# Patient Record
Sex: Female | Born: 1985 | Race: Black or African American | Hispanic: No | Marital: Single | Smoking: Former smoker
Health system: Southern US, Community
[De-identification: ages and names within clinical notes are randomized; demographics above are authoritative.]

## PROBLEM LIST (undated history)

## (undated) ENCOUNTER — Inpatient Hospital Stay (HOSPITAL_COMMUNITY): Payer: Self-pay

## (undated) DIAGNOSIS — Z789 Other specified health status: Secondary | ICD-10-CM

## (undated) DIAGNOSIS — G43909 Migraine, unspecified, not intractable, without status migrainosus: Secondary | ICD-10-CM

## (undated) HISTORY — DX: Migraine, unspecified, not intractable, without status migrainosus: G43.909

---

## 2012-12-27 ENCOUNTER — Emergency Department (HOSPITAL_COMMUNITY)
Admission: EM | Admit: 2012-12-27 | Discharge: 2012-12-27 | Disposition: A | Discharge status: Home / Self Care | Payer: 59 | Attending: Emergency Medicine | Admitting: Emergency Medicine

## 2012-12-27 ENCOUNTER — Encounter (HOSPITAL_COMMUNITY): Payer: Self-pay | Admitting: *Deleted

## 2012-12-27 DIAGNOSIS — R52 Pain, unspecified: Secondary | ICD-10-CM | POA: Insufficient documentation

## 2012-12-27 DIAGNOSIS — R0789 Other chest pain: Secondary | ICD-10-CM

## 2012-12-27 DIAGNOSIS — R071 Chest pain on breathing: Secondary | ICD-10-CM | POA: Insufficient documentation

## 2012-12-27 DIAGNOSIS — F17200 Nicotine dependence, unspecified, uncomplicated: Secondary | ICD-10-CM | POA: Insufficient documentation

## 2012-12-27 LAB — POCT I-STAT, CHEM 8
Calcium, Ion: 1.15 mmol/L (ref 1.12–1.23)
Chloride: 105 mEq/L (ref 96–112)
Creatinine, Ser: 0.7 mg/dL (ref 0.50–1.10)
Glucose, Bld: 83 mg/dL (ref 70–99)
Hemoglobin: 12.2 g/dL (ref 12.0–15.0)
Potassium: 3.9 mEq/L (ref 3.5–5.1)

## 2012-12-27 NOTE — ED Provider Notes (Signed)
Medical screening examination/treatment/procedure(s) were performed by non-physician practitioner and as supervising physician I was immediately available for consultation/collaboration.   Rolan Bucco, MD 12/27/12 [phone]

## 2012-12-27 NOTE — ED Provider Notes (Signed)
History     CSN: [id number]  Arrival date & time 12/27/12  1249   First MD Initiated Contact with Patient 12/27/12 1321      Chief Complaint  Patient presents with  . Chest Pain    (Consider location/radiation/quality/duration/timing/severity/associated sxs/prior treatment) HPI Comments: Shelby Love is a 27 y.o. Female that presents emergency department with chief complaint of chest pain.  Onset of symptoms began this morning around 1130 and is located primarily in the left chest wall.  Patient is on aware of any alleviating symptoms the pain is exacerbated by certain arm movements and deep breath.  Pain is described as a tight sensation that is tender to touch.  Patient denies any associated symptoms including shortness of breath, diaphoresis, nausea, cough, hemoptysis, leg swelling, claudication, syncope, fever, night sweats or chills.  Note that patient is a current everyday smoker and is taking estrogen birth control.  No other complaints this time.    The history is provided by the patient.    History reviewed. No pertinent past medical history.  History reviewed. No pertinent past surgical history.  History reviewed. No pertinent family history.  History  Substance Use Topics  . Smoking status: Current Every Day Smoker    Types: Cigarettes  . Smokeless tobacco: Not on file  . Alcohol Use: Yes     Comment: occa    OB History    Grav Para Term Preterm Abortions TAB SAB Ect Mult Living                  Review of Systems  Constitutional: Negative for fever, chills, diaphoresis, fatigue and unexpected weight change.  HENT: Negative for congestion, neck pain and neck stiffness.   Eyes: Negative for visual disturbance.  Respiratory: Positive for chest tightness. Negative for apnea, cough, shortness of breath, wheezing and stridor.   Cardiovascular: Positive for chest pain. Negative for palpitations and leg swelling.  Gastrointestinal: Negative for nausea, vomiting,  abdominal pain, diarrhea and blood in stool.  Genitourinary: Negative for dysuria, urgency, hematuria and flank pain.  Musculoskeletal: Negative for myalgias, back pain and gait problem.  Skin: Negative for pallor.  Neurological: Negative for dizziness, syncope, light-headedness and headaches.  All other systems reviewed and are negative.    Allergies  Banana and Erythromycin base  Home Medications   Current Outpatient Rx  Name  Route  Sig  Dispense  Refill  . VITAMIN C 500 MG PO CHEW   Oral   Chew 500 mg by mouth daily.         Marland Kitchen DOXYCYCLINE HYCLATE 100 MG PO TABS   Oral   Take 100 mg by mouth daily.         . DROSPIRENONE-ETHINYL ESTRADIOL 3-0.02 MG PO TABS   Oral   Take 1 tablet by mouth daily.           BP 131/74  Pulse 98  Temp 98.9 F (37.2 C) (Oral)  Resp 20  SpO2 100%  Physical Exam  Nursing note and vitals reviewed. Constitutional: She appears well-developed and well-nourished. No distress.  HENT:  Head: Normocephalic and atraumatic.  Eyes: Conjunctivae normal and EOM are normal. Pupils are equal, round, and reactive to light.  Neck: Normal range of motion. Neck supple. Normal carotid pulses and no JVD present. Carotid bruit is not present. No rigidity. Normal range of motion present.  Cardiovascular: Normal rate, regular rhythm, S1 normal, S2 normal, normal heart sounds, intact distal pulses and normal pulses.  Exam reveals  no gallop and no friction rub.   No murmur heard.      No pitting edema bilaterally, RRR, no aberrant sounds on auscultations, distal pulses intact, no carotid bruit or JVD.   Pulmonary/Chest: Effort normal and breath sounds normal. No accessory muscle usage or stridor. No respiratory distress. She exhibits tenderness. She exhibits no bony tenderness.       Reproducible left-sided chest wall tenderness.  Abdominal: Bowel sounds are normal.       Soft non tender. Non pulsatile aorta.   Skin: Skin is warm, dry and intact. No rash  noted. She is not diaphoretic. No cyanosis. Nails show no clubbing.    ED Course  Procedures (including critical care time)   Labs Reviewed  D-DIMER, QUANTITATIVE  POCT I-STAT, CHEM 8   No results found.   No diagnosis found.   Date: 12/27/2012  Rate: 84  Rhythm: normal sinus rhythm  QRS Axis: normal  Intervals: normal  ST/T Wave abnormalities: normal  Conduction Disutrbances: none  Narrative Interpretation:   Old EKG Reviewed: No old ECG     MDM  Patient is to be discharged with recommendation to follow up with PCP in regards to today's hospital visit. Chest pain is not likely of cardiac or pulmonary etiology d/t presentation, negative ddimer, VSS, no tracheal deviation, no JVD or new murmur, RRR, breath sounds equal bilaterally, EKG without acute abnormalities.  Smoking cessation discussed as well as increased risk factors for pulmonary embolism development while being on birth control when smoking.  Patient verbalizes understanding that she either needs to stop taking her birth control or quit smoking.  Likely etiology of chest pain is musculoskeletal. Pt appears reliable for follow up and is agreeable to discharge.            Jaci Carrel, [place] 12/27/12 1528

## 2012-12-27 NOTE — ED Notes (Signed)
Pt reports L side upper chest pain which started x 1 hour ago while sitting at work.  Pt describes chest pain being sore, which radiates under her L arm.  Pt has family hx of MI.  Pt denies any SOB or n/v at this time.

## 2013-04-15 ENCOUNTER — Encounter (HOSPITAL_COMMUNITY): Payer: Self-pay | Admitting: Emergency Medicine

## 2013-04-15 ENCOUNTER — Emergency Department (HOSPITAL_COMMUNITY)
Admission: EM | Admit: 2013-04-15 | Discharge: 2013-04-15 | Disposition: A | Discharge status: Home / Self Care | Payer: 59 | Attending: Emergency Medicine | Admitting: Emergency Medicine

## 2013-04-15 DIAGNOSIS — Y9389 Activity, other specified: Secondary | ICD-10-CM | POA: Insufficient documentation

## 2013-04-15 DIAGNOSIS — S61211A Laceration without foreign body of left index finger without damage to nail, initial encounter: Secondary | ICD-10-CM

## 2013-04-15 DIAGNOSIS — S61209A Unspecified open wound of unspecified finger without damage to nail, initial encounter: Secondary | ICD-10-CM | POA: Insufficient documentation

## 2013-04-15 DIAGNOSIS — Z23 Encounter for immunization: Secondary | ICD-10-CM | POA: Insufficient documentation

## 2013-04-15 DIAGNOSIS — W260XXA Contact with knife, initial encounter: Secondary | ICD-10-CM | POA: Insufficient documentation

## 2013-04-15 DIAGNOSIS — Y929 Unspecified place or not applicable: Secondary | ICD-10-CM | POA: Insufficient documentation

## 2013-04-15 DIAGNOSIS — F17200 Nicotine dependence, unspecified, uncomplicated: Secondary | ICD-10-CM | POA: Insufficient documentation

## 2013-04-15 MED ORDER — TETANUS-DIPHTH-ACELL PERTUSSIS 5-2.5-18.5 LF-MCG/0.5 IM SUSP
0.5000 mL | Freq: Once | INTRAMUSCULAR | Status: AC
  Administered 2013-04-15: 0.5 mL via INTRAMUSCULAR
  Filled 2013-04-15: qty 0.5

## 2013-04-15 NOTE — ED Notes (Signed)
Patient was trying to open a bag of sugar when the knife slipped and cut her left 2nd finger.

## 2013-04-15 NOTE — ED Provider Notes (Signed)
History    This chart was scribed for Emilia Beck PA-C, a non-physician practitioner working with Raeford Razor, MD by Lewanda Rife, ED Scribe. This patient was seen in room WTR9/WTR9 and the patient's care was started at 2234.     CSN: [id number]  Arrival date & time 04/15/13  2116   First MD Initiated Contact with Patient 04/15/13 2143      Chief Complaint  Patient presents with  . Extremity Laceration    (Consider location/radiation/quality/duration/timing/severity/associated sxs/prior treatment) HPI Shelby Love is a 27 y.o. female who presents to the Emergency Department complaining of laceration of left index finger pad onset prior to arrival. Reports she was opening a bag of sugar with a large knife and it slipped. Pt reports moderate constant pain. Reports she was able to control bleeding with pressure dressing. Denies any aggravating alleviating factors. Pt denies taking any medications prior to arrival to pain. Reports unknown tetanus status.    History reviewed. No pertinent past medical history.  History reviewed. No pertinent past surgical history.  No family history on file.  History  Substance Use Topics  . Smoking status: Current Every Day Smoker    Types: Cigarettes  . Smokeless tobacco: Not on file  . Alcohol Use: Yes     Comment: occa    OB History   Grav Para Term Preterm Abortions TAB SAB Ect Mult Living                  Review of Systems A complete 10 system review of systems was obtained and all systems are negative except as noted in the HPI and PMH.    Allergies  Banana and Erythromycin base  Home Medications   Current Outpatient Rx  Name  Route  Sig  Dispense  Refill  . Ascorbic Acid (VITAMIN C) 500 MG CHEW   Oral   Chew 500 mg by mouth daily.         Marland Kitchen doxycycline (VIBRA-TABS) 100 MG tablet   Oral   Take 100 mg by mouth daily.         . drospirenone-ethinyl estradiol (LORYNA) 3-0.02 MG tablet   Oral   Take 1 tablet  by mouth daily.           BP 128/86  Pulse 95  Temp(Src) 98.5 F (36.9 C) (Oral)  Resp 20  Wt 147 lb (66.679 kg)  SpO2 98%  LMP 03/25/2013  Physical Exam  Nursing note and vitals reviewed. Constitutional: She is oriented to person, place, and time. She appears well-developed and well-nourished. No distress.  HENT:  Head: Normocephalic and atraumatic.  Eyes: EOM are normal.  Neck: Neck supple. No tracheal deviation present.  Cardiovascular: Normal rate.   Pulmonary/Chest: Effort normal. No respiratory distress.  Musculoskeletal: Normal range of motion.  Neurological: She is alert and oriented to person, place, and time.  Skin: Skin is warm and dry. Laceration noted.     1.5 cm laceration noted on distal aspect of left index finger pad  Psychiatric: She has a normal mood and affect. Her behavior is normal.    ED Course  Procedures (including critical care time) Medications - No data to display LACERATION REPAIR Performed by: Emilia Beck PA-C Consent: Verbal consent obtained. Risks and benefits: risks, benefits and alternatives were discussed Patient identity confirmed: provided demographic data Time out performed prior to procedure Prepped and Draped in normal sterile fashion Wound explored Laceration Location: distal aspect of left index finger on finger pad Laceration  Length: 1.5 cm No Foreign Bodies seen or palpated Anesthesia: local infiltration Local anesthetic: lidocaine 1% without epinephrine Anesthetic total: 5 ml Irrigation method: syringe Amount of cleaning: standard Skin closure: 4-0 Prolene Number of sutures or staples: 3 sutures Technique: simple interrupted  Patient tolerance: Patient tolerated the procedure well with no immediate complications.  Labs Reviewed - No data to display No results found.   1. Laceration of left index finger w/o foreign body w/o damage to nail, initial encounter       MDM  Laceration repaired without  complications. Patient will have tetanus shot here. Patient instructed to return in 10 days for suture removal. Patient instructed to return with worsening or concerning symptoms.       I personally performed the services described in this documentation, which was scribed in my presence. The recorded information has been reviewed and is accurate.    Emilia Beck, PA-C 04/16/13 1519

## 2013-04-23 NOTE — ED Provider Notes (Signed)
Medical screening examination/treatment/procedure(s) were performed by non-physician practitioner and as supervising physician I was immediately available for consultation/collaboration.  Raeford Razor, MD 04/23/13 [phone]

## 2013-09-30 LAB — OB RESULTS CONSOLE RPR: RPR: NONREACTIVE

## 2013-09-30 LAB — OB RESULTS CONSOLE ABO/RH: RH Type: POSITIVE

## 2013-09-30 LAB — OB RESULTS CONSOLE RUBELLA ANTIBODY, IGM: Rubella: UNDETERMINED

## 2013-09-30 LAB — OB RESULTS CONSOLE HIV ANTIBODY (ROUTINE TESTING): HIV: NONREACTIVE

## 2013-09-30 LAB — OB RESULTS CONSOLE HEPATITIS B SURFACE ANTIGEN: HEP B S AG: NEGATIVE

## 2013-10-10 LAB — OB RESULTS CONSOLE GC/CHLAMYDIA
Chlamydia: NEGATIVE
Gonorrhea: NEGATIVE

## 2013-12-19 NOTE — L&D Delivery Note (Signed)
Delivery Note At 11:09 AM a viable female was delivered via Vaginal, Spontaneous Delivery (Presentation: ; Occiput Anterior).  APGAR: 9, 9; weight pending Placenta status: Intact, Spontaneous.  Cord: 3 vessels with the following complications: None.  Cord pH: not indicated  Anesthesia: Epidural  Episiotomy: None Lacerations: 1st degree Suture Repair: 3.0 vicryl rapide and 2 interrupted 4-0 vicryl rapide sutures Est. Blood Loss (mL):   Mom to postpartum.  Baby to Couplet care / Skin to Skin.  Jatia Musa A. Delaynee Alred 04/19/2014, 11:31 AM

## 2014-03-27 LAB — OB RESULTS CONSOLE GBS: STREP GROUP B AG: NEGATIVE

## 2014-04-18 ENCOUNTER — Encounter (HOSPITAL_COMMUNITY): Payer: Self-pay | Admitting: *Deleted

## 2014-04-18 ENCOUNTER — Inpatient Hospital Stay (HOSPITAL_COMMUNITY)
Admission: AD | Admit: 2014-04-18 | Discharge: 2014-04-21 | DRG: 775 | Disposition: A | Discharge status: Home / Self Care | Payer: 59 | Source: Ambulatory Visit | Attending: Obstetrics | Admitting: Obstetrics

## 2014-04-18 DIAGNOSIS — O34599 Maternal care for other abnormalities of gravid uterus, unspecified trimester: Secondary | ICD-10-CM | POA: Diagnosis present

## 2014-04-18 DIAGNOSIS — O139 Gestational [pregnancy-induced] hypertension without significant proteinuria, unspecified trimester: Principal | ICD-10-CM | POA: Diagnosis present

## 2014-04-18 DIAGNOSIS — N83209 Unspecified ovarian cyst, unspecified side: Secondary | ICD-10-CM | POA: Diagnosis present

## 2014-04-18 DIAGNOSIS — D62 Acute posthemorrhagic anemia: Secondary | ICD-10-CM | POA: Diagnosis not present

## 2014-04-18 DIAGNOSIS — O9903 Anemia complicating the puerperium: Secondary | ICD-10-CM | POA: Diagnosis not present

## 2014-04-18 DIAGNOSIS — O99334 Smoking (tobacco) complicating childbirth: Secondary | ICD-10-CM | POA: Diagnosis present

## 2014-04-18 HISTORY — DX: Other specified health status: Z78.9

## 2014-04-18 LAB — TYPE AND SCREEN
ABO/RH(D): O POS
Antibody Screen: NEGATIVE

## 2014-04-18 LAB — CBC
HCT: 39 % (ref 36.0–46.0)
Hemoglobin: 13 g/dL (ref 12.0–15.0)
MCH: 29.1 pg (ref 26.0–34.0)
MCHC: 33.3 g/dL (ref 30.0–36.0)
MCV: 87.2 fL (ref 78.0–100.0)
PLATELETS: 322 10*3/uL (ref 150–400)
RBC: 4.47 MIL/uL (ref 3.87–5.11)
RDW: 15.3 % (ref 11.5–15.5)
WBC: 13.4 10*3/uL — ABNORMAL HIGH (ref 4.0–10.5)

## 2014-04-18 LAB — COMPREHENSIVE METABOLIC PANEL
ALT: 14 U/L (ref 0–35)
AST: 18 U/L (ref 0–37)
Albumin: 3.1 g/dL — ABNORMAL LOW (ref 3.5–5.2)
Alkaline Phosphatase: 151 U/L — ABNORMAL HIGH (ref 39–117)
BUN: 4 mg/dL — ABNORMAL LOW (ref 6–23)
CALCIUM: 10.3 mg/dL (ref 8.4–10.5)
CO2: 22 mEq/L (ref 19–32)
CREATININE: 0.51 mg/dL (ref 0.50–1.10)
Chloride: 98 mEq/L (ref 96–112)
GFR calc Af Amer: >90 mL/min (ref >90)
GFR calc non Af Amer: >90 mL/min (ref >90)
GLUCOSE: 85 mg/dL (ref 70–99)
Potassium: 3.7 mEq/L (ref 3.7–5.3)
SODIUM: 138 meq/L (ref 137–147)
Total Bilirubin: 0.2 mg/dL — ABNORMAL LOW (ref 0.3–1.2)
Total Protein: 7.6 g/dL (ref 6.0–8.3)

## 2014-04-18 LAB — ABO/RH: ABO/RH(D): O POS

## 2014-04-18 LAB — URIC ACID: Uric Acid, Serum: 5.3 mg/dL (ref 2.4–7.0)

## 2014-04-18 LAB — RPR

## 2014-04-18 MED ORDER — OXYTOCIN 40 UNITS IN LACTATED RINGERS INFUSION - SIMPLE MED
62.5000 mL/h | INTRAVENOUS | Status: DC
  Administered 2014-04-19: 62.5 mL/h via INTRAVENOUS

## 2014-04-18 MED ORDER — LIDOCAINE HCL (PF) 1 % IJ SOLN
30.0000 mL | INTRAMUSCULAR | Status: DC | PRN
  Filled 2014-04-18: qty 30

## 2014-04-18 MED ORDER — OXYCODONE-ACETAMINOPHEN 5-325 MG PO TABS
1.0000 | ORAL_TABLET | ORAL | Status: DC | PRN
Start: 2014-04-18 — End: 2014-04-19

## 2014-04-18 MED ORDER — LABETALOL HCL 100 MG PO TABS
100.0000 mg | ORAL_TABLET | Freq: Once | ORAL | Status: AC
Start: 2014-04-18 — End: 2014-04-18
  Administered 2014-04-18: 100 mg via ORAL
  Filled 2014-04-18: qty 1

## 2014-04-18 MED ORDER — CITRIC ACID-SODIUM CITRATE 334-500 MG/5ML PO SOLN
30.0000 mL | ORAL | Status: DC | PRN
  Administered 2014-04-18: 30 mL via ORAL
  Filled 2014-04-18: qty 15

## 2014-04-18 MED ORDER — ACETAMINOPHEN 325 MG PO TABS
650.0000 mg | ORAL_TABLET | ORAL | Status: DC | PRN
  Administered 2014-04-18 – 2014-04-19 (×3): 650 mg via ORAL
  Filled 2014-04-18 (×3): qty 2

## 2014-04-18 MED ORDER — OXYTOCIN 40 UNITS IN LACTATED RINGERS INFUSION - SIMPLE MED
1.0000 m[IU]/min | INTRAVENOUS | Status: DC
  Administered 2014-04-18: 2 m[IU]/min via INTRAVENOUS
  Filled 2014-04-18: qty 1000

## 2014-04-18 MED ORDER — ZOLPIDEM TARTRATE 5 MG PO TABS: 5.0000 mg | ORAL_TABLET | Freq: Every evening | ORAL | Status: DC | PRN

## 2014-04-18 MED ORDER — LABETALOL HCL 5 MG/ML IV SOLN
10.0000 mg | Freq: Once | INTRAVENOUS | Status: AC
  Administered 2014-04-18: 10 mg via INTRAVENOUS
  Filled 2014-04-18: qty 4

## 2014-04-18 MED ORDER — LACTATED RINGERS IV SOLN: 500.0000 mL | INTRAVENOUS | Status: DC | PRN

## 2014-04-18 MED ORDER — IBUPROFEN 600 MG PO TABS: 600.0000 mg | ORAL_TABLET | Freq: Four times a day (QID) | ORAL | Status: DC | PRN

## 2014-04-18 MED ORDER — OXYTOCIN BOLUS FROM INFUSION: 500.0000 mL | INTRAVENOUS | Status: DC

## 2014-04-18 MED ORDER — ONDANSETRON HCL 4 MG/2ML IJ SOLN: 4.0000 mg | Freq: Four times a day (QID) | INTRAMUSCULAR | Status: DC | PRN

## 2014-04-18 MED ORDER — TERBUTALINE SULFATE 1 MG/ML IJ SOLN
0.2500 mg | Freq: Once | INTRAMUSCULAR | Status: AC | PRN
Start: 2014-04-18 — End: 2014-04-18

## 2014-04-18 MED ORDER — LACTATED RINGERS IV SOLN
INTRAVENOUS | Status: DC
  Administered 2014-04-18 – 2014-04-19 (×3): via INTRAVENOUS

## 2014-04-18 NOTE — Progress Notes (Signed)
S: Doing well, no complaints, pain well controlled, just starting to feel contractions, not painful, planning epidural. HA earlier today, very mild at present, no vision change.   O: BP 150/99  Pulse 86  Temp(Src) 98.2 F (36.8 C) (Oral)  Resp 20  Ht 5\' 7"  (1.702 m)  Wt 91.627 kg (202 lb)  BMI 31.63 kg/m2   FHT:  FHR: 140s bpm, variability: moderate,  accelerations:  Present,  decelerations:  Absent UC:   irregular, every 5 minutes SVE:   Dilation: 2 Effacement (%): 60 Station: -3 Exam by:: Dr. Algie CofferFogelman   A / P:  [age] y.o.  Obstetric History   G1   P0   T0   P0   A0   TAB0   SAB0   E0   M0   L0    at 6369w3d IOL due to gest htn, no sx PEC, bp controlled w/ one IV dose and one po dose labetalol Cont to increase pitocin  Fetal Wellbeing:  Category I Pain Control:  epidural planned  Anticipated MOD:  NSVD  Shelby Love A. Shelby Love 04/18/2014, 8:56 PM

## 2014-04-18 NOTE — Progress Notes (Signed)
Dr Ernestina PennaFogleman updated on BP, pulse, lab values, FHR, UC pattern and pt comfort level.  Orders given to give 100mg  PO labetalol once and 10mg  IV labetalol once.  RN to continue to monitor BP and will notify physician if BP continues to be high.

## 2014-04-18 NOTE — H&P (Signed)
Shelby HamiltonMiesha Love is a 28 y.o. G1P0 at 7561w3d presenting for IOL due to worsening gestational hypertension. Pt notes occasional contractions . Good fetal movement, No vaginal bleeding, not leaking fluid. No significant headache, no vision change, no right upper quadrant pain.  [facility] since 6 wks - 8 cm right ovarian cyst, cystic in appearance Low-lying placenta, resolved Elevated one-hour diabetic screen at 145, normal three-hour screen, 55 pound weight gain in pregnancy, normal growth ultrasound at 37 weeks at the 64th percentile    Prenatal Transfer Tool  Maternal Diabetes: No Genetic Screening: Normal Maternal Ultrasounds/Referrals: Normal Fetal Ultrasounds or other Referrals:  None Maternal Substance Abuse:  No Significant Maternal Medications:  None Significant Maternal Lab Results: None     OB History   Grav Para Term Preterm Abortions TAB SAB Ect Mult Living   1              No past medical history on file. No past surgical history on file. Family History: family history is not on file. Social History:  reports that she has been smoking Cigarettes.  She has been smoking about 0.00 packs per day. She does not have any smokeless tobacco history on file. She reports that she drinks alcohol. She reports that she does not use illicit drugs.  Review of Systems - Negative except Discomfort of pregnancy     Temperature 99.2 F (37.3 C), temperature source Oral, resp. rate 18, height 5\' 7"  (1.702 m), weight 91.627 kg (202 lb).  Physical Exam:  Gen: well appearing, no distress CV: RRR Pulm: CTAB Back: no CVAT Abd: gravid, NT, no RUQ pain LE: Trace edema, equal bilaterally, non-tender Toco: Q3 to 6 minutes FH: baseline 145, accelerations present, no deceleratons, 10 beat variability  Prenatal labs: ABO, Rh:   O. positive Antibody:   negative Rubella:   equivocal RPR:   nonreactive HBsAg:   negative HIV:   negative GBS:   negative 1 hr Glucola 145, normal  three-hour  Genetic screening normal quad screen Anatomy US normal female   Assessment/Plan: 28 y.o. G1P0 at 7761w3d Worsening gestational hypertension, no evidence of preeclampsia. Recommend moved to induction of labor due to worsening blood pressures. Will repeat labs to assess for preeclampsia. Given favorable cervix we'll plan Pitocin. AROM when entering active phase. Okay for epidural Reactive fetal testing Rubella equivocal recheck postpartum  Shelby Love 04/18/2014 3:39 PM     Tresa EndoKelly A. Shelby Love 04/18/2014, 3:35 PM

## 2014-04-19 ENCOUNTER — Encounter (HOSPITAL_COMMUNITY): Payer: 59 | Admitting: Anesthesiology

## 2014-04-19 ENCOUNTER — Inpatient Hospital Stay (HOSPITAL_COMMUNITY): Payer: 59 | Admitting: Anesthesiology

## 2014-04-19 ENCOUNTER — Encounter (HOSPITAL_COMMUNITY): Payer: Self-pay | Admitting: *Deleted

## 2014-04-19 LAB — COMPREHENSIVE METABOLIC PANEL
ALT: 11 U/L (ref 0–35)
AST: 15 U/L (ref 0–37)
Albumin: 2.6 g/dL — ABNORMAL LOW (ref 3.5–5.2)
Alkaline Phosphatase: 118 U/L — ABNORMAL HIGH (ref 39–117)
BUN: 4 mg/dL — ABNORMAL LOW (ref 6–23)
CO2: 23 mEq/L (ref 19–32)
Calcium: 8.6 mg/dL (ref 8.4–10.5)
Chloride: 101 mEq/L (ref 96–112)
Creatinine, Ser: 0.59 mg/dL (ref 0.50–1.10)
GFR calc Af Amer: >90 mL/min (ref >90)
GFR calc non Af Amer: >90 mL/min (ref >90)
Glucose, Bld: 86 mg/dL (ref 70–99)
Potassium: 3.7 mEq/L (ref 3.7–5.3)
Sodium: 139 mEq/L (ref 137–147)
TOTAL PROTEIN: 6 g/dL (ref 6.0–8.3)
Total Bilirubin: 0.4 mg/dL (ref 0.3–1.2)

## 2014-04-19 LAB — CBC
HCT: 30.8 % — ABNORMAL LOW (ref 36.0–46.0)
HEMOGLOBIN: 10.5 g/dL — AB (ref 12.0–15.0)
MCH: 29.4 pg (ref 26.0–34.0)
MCHC: 33.8 g/dL (ref 30.0–36.0)
MCV: 87 fL (ref 78.0–100.0)
Platelets: 252 10*3/uL (ref 150–400)
RBC: 3.54 MIL/uL — ABNORMAL LOW (ref 3.87–5.11)
RDW: 15.4 % (ref 11.5–15.5)
WBC: 12.9 10*3/uL — ABNORMAL HIGH (ref 4.0–10.5)

## 2014-04-19 LAB — URIC ACID: Uric Acid, Serum: 5.4 mg/dL (ref 2.4–7.0)

## 2014-04-19 MED ORDER — ZOLPIDEM TARTRATE 5 MG PO TABS: 5.0000 mg | ORAL_TABLET | Freq: Every evening | ORAL | Status: DC | PRN

## 2014-04-19 MED ORDER — SIMETHICONE 80 MG PO CHEW: 80.0000 mg | CHEWABLE_TABLET | ORAL | Status: DC | PRN

## 2014-04-19 MED ORDER — WITCH HAZEL-GLYCERIN EX PADS: 1.0000 "application " | MEDICATED_PAD | CUTANEOUS | Status: DC | PRN

## 2014-04-19 MED ORDER — PRENATAL MULTIVITAMIN CH
1.0000 | ORAL_TABLET | Freq: Every day | ORAL | Status: DC
  Administered 2014-04-19 – 2014-04-20 (×2): 1 via ORAL
  Filled 2014-04-19 (×2): qty 1

## 2014-04-19 MED ORDER — PHENYLEPHRINE 40 MCG/ML (10ML) SYRINGE FOR IV PUSH (FOR BLOOD PRESSURE SUPPORT)
80.0000 ug | PREFILLED_SYRINGE | INTRAVENOUS | Status: DC | PRN
  Filled 2014-04-19: qty 2

## 2014-04-19 MED ORDER — ONDANSETRON HCL 4 MG PO TABS: 4.0000 mg | ORAL_TABLET | ORAL | Status: DC | PRN

## 2014-04-19 MED ORDER — LANOLIN HYDROUS EX OINT: TOPICAL_OINTMENT | CUTANEOUS | Status: DC | PRN

## 2014-04-19 MED ORDER — IBUPROFEN 600 MG PO TABS
600.0000 mg | ORAL_TABLET | Freq: Four times a day (QID) | ORAL | Status: DC
  Administered 2014-04-19 – 2014-04-20 (×6): 600 mg via ORAL
  Filled 2014-04-19 (×7): qty 1

## 2014-04-19 MED ORDER — EPHEDRINE 5 MG/ML INJ
10.0000 mg | INTRAVENOUS | Status: DC | PRN
  Filled 2014-04-19: qty 2

## 2014-04-19 MED ORDER — BENZOCAINE-MENTHOL 20-0.5 % EX AERO
1.0000 "application " | INHALATION_SPRAY | CUTANEOUS | Status: DC | PRN
  Administered 2014-04-19: 1 via TOPICAL
  Filled 2014-04-19: qty 56

## 2014-04-19 MED ORDER — PNEUMOCOCCAL VAC POLYVALENT 25 MCG/0.5ML IJ INJ
0.5000 mL | INJECTION | INTRAMUSCULAR | Status: DC
  Filled 2014-04-19: qty 0.5

## 2014-04-19 MED ORDER — LACTATED RINGERS IV SOLN
500.0000 mL | Freq: Once | INTRAVENOUS | Status: AC
  Administered 2014-04-19: 500 mL via INTRAVENOUS

## 2014-04-19 MED ORDER — BISACODYL 10 MG RE SUPP: 10.0000 mg | Freq: Every day | RECTAL | Status: DC | PRN

## 2014-04-19 MED ORDER — DIPHENHYDRAMINE HCL 50 MG/ML IJ SOLN
12.5000 mg | INTRAMUSCULAR | Status: DC | PRN
  Administered 2014-04-19: 06:00:00 via INTRAVENOUS
  Filled 2014-04-19: qty 1

## 2014-04-19 MED ORDER — SENNOSIDES-DOCUSATE SODIUM 8.6-50 MG PO TABS
2.0000 | ORAL_TABLET | ORAL | Status: DC
  Administered 2014-04-19 – 2014-04-20 (×2): 2 via ORAL
  Filled 2014-04-19 (×2): qty 2

## 2014-04-19 MED ORDER — FENTANYL 2.5 MCG/ML BUPIVACAINE 1/10 % EPIDURAL INFUSION (WH - ANES)
14.0000 mL/h | INTRAMUSCULAR | Status: DC | PRN
  Filled 2014-04-19 (×2): qty 125

## 2014-04-19 MED ORDER — DIBUCAINE 1 % RE OINT: 1.0000 "application " | TOPICAL_OINTMENT | RECTAL | Status: DC | PRN

## 2014-04-19 MED ORDER — FLEET ENEMA 7-19 GM/118ML RE ENEM: 1.0000 | ENEMA | Freq: Every day | RECTAL | Status: DC | PRN

## 2014-04-19 MED ORDER — FENTANYL 2.5 MCG/ML BUPIVACAINE 1/10 % EPIDURAL INFUSION (WH - ANES)
INTRAMUSCULAR | Status: DC | PRN
  Administered 2014-04-19: 14 mL/h via EPIDURAL

## 2014-04-19 MED ORDER — MEASLES, MUMPS & RUBELLA VAC ~~LOC~~ INJ
0.5000 mL | INJECTION | Freq: Once | SUBCUTANEOUS | Status: DC
  Filled 2014-04-19: qty 0.5

## 2014-04-19 MED ORDER — FENTANYL 2.5 MCG/ML BUPIVACAINE 1/10 % EPIDURAL INFUSION (WH - ANES)
14.0000 mL/h | INTRAMUSCULAR | Status: DC | PRN
  Administered 2014-04-19: 14 mL/h via EPIDURAL

## 2014-04-19 MED ORDER — LABETALOL HCL 5 MG/ML IV SOLN
20.0000 mg | Freq: Once | INTRAVENOUS | Status: AC
  Administered 2014-04-19: 20 mg via INTRAVENOUS
  Filled 2014-04-19: qty 4

## 2014-04-19 MED ORDER — ACETAMINOPHEN 500 MG PO TABS: 1000.0000 mg | ORAL_TABLET | Freq: Four times a day (QID) | ORAL | Status: DC | PRN

## 2014-04-19 MED ORDER — EPHEDRINE 5 MG/ML INJ
10.0000 mg | INTRAVENOUS | Status: DC | PRN
  Filled 2014-04-19: qty 4
  Filled 2014-04-19: qty 2

## 2014-04-19 MED ORDER — DIPHENHYDRAMINE HCL 25 MG PO CAPS: 25.0000 mg | ORAL_CAPSULE | Freq: Four times a day (QID) | ORAL | Status: DC | PRN

## 2014-04-19 MED ORDER — ONDANSETRON HCL 4 MG/2ML IJ SOLN: 4.0000 mg | INTRAMUSCULAR | Status: DC | PRN

## 2014-04-19 MED ORDER — OXYCODONE-ACETAMINOPHEN 5-325 MG PO TABS
1.0000 | ORAL_TABLET | ORAL | Status: DC | PRN
  Administered 2014-04-20 (×2): 1 via ORAL
  Filled 2014-04-19 (×2): qty 1

## 2014-04-19 MED ORDER — TETANUS-DIPHTH-ACELL PERTUSSIS 5-2.5-18.5 LF-MCG/0.5 IM SUSP: 0.5000 mL | Freq: Once | INTRAMUSCULAR | Status: DC

## 2014-04-19 MED ORDER — LIDOCAINE HCL (PF) 1 % IJ SOLN
INTRAMUSCULAR | Status: DC | PRN
  Administered 2014-04-19: 5 mL
  Administered 2014-04-19: 3 mL
  Administered 2014-04-19: 5 mL

## 2014-04-19 MED ORDER — PHENYLEPHRINE 40 MCG/ML (10ML) SYRINGE FOR IV PUSH (FOR BLOOD PRESSURE SUPPORT)
80.0000 ug | PREFILLED_SYRINGE | INTRAVENOUS | Status: DC | PRN
  Filled 2014-04-19: qty 10
  Filled 2014-04-19: qty 2

## 2014-04-19 NOTE — Progress Notes (Signed)
S: Doing well, no complaints, pain well controlled with epidural, slept off and on since epidural placed. No HA  O: BP 142/91  Pulse 96  Temp(Src) 97 F (36.1 C) (Oral)  Resp 20  Ht 5\' 7"  (1.702 m)  Wt 91.627 kg (202 lb)  BMI 31.63 kg/m2  SpO2 97%   FHT:  FHR: 150s bpm, variability: moderate,  accelerations:  Present,  decelerations:  Absent UC:   regular, every 4 minutes, pit at 26 munits/ min SVE:   Dilation: 10 Effacement (%): 100 Station: +2 Exam by:: dr Ernestina Pennafogleman   A / P:  27 y.o.  Obstetric History   G1   P0   T0   P0   A0   TAB0   SAB0   E0   M0   L0    at 5951w4d IOL, good progress,. gest htn, bp with good control since epidural  Fetal Wellbeing:  Category I Pain Control:  Epidural  Anticipated MOD:  NSVD  Shelby Love A. Demontae Antunes 04/19/2014, 10:40 AM

## 2014-04-19 NOTE — Anesthesia Procedure Notes (Signed)
Epidural Patient location during procedure: OB  Staffing Anesthesiologist: China Deitrick Performed by: anesthesiologist   Preanesthetic Checklist Completed: patient identified, site marked, surgical consent, pre-op evaluation, timeout performed, IV checked, risks and benefits discussed and monitors and equipment checked  Epidural Patient position: sitting Prep: ChloraPrep Patient monitoring: heart rate, continuous pulse ox and blood pressure Approach: right paramedian Location: L3-L4 Injection technique: LOR saline  Needle:  Needle type: Tuohy  Needle gauge: 17 G Needle length: 9 cm and 9 Needle insertion depth: 6 cm Catheter type: closed end flexible Catheter size: 20 Guage Catheter at skin depth: 10 cm Test dose: negative  Assessment Events: blood not aspirated, injection not painful, no injection resistance, negative IV test and no paresthesia  Additional Notes   Patient tolerated the insertion well without complications.   

## 2014-04-19 NOTE — Plan of Care (Signed)
Problem: Phase II Progression Outcomes Goal: Rh isoimmunization per orders Outcome: Not Applicable Date Met:  56/38/93 O+ mom

## 2014-04-19 NOTE — Progress Notes (Addendum)
S: Doing well, no complaints, pain well controlled, just received epidural. No HA after Tylenol, no vision change, no N/V, no RUQ pain  O: BP 146/98  Pulse 81  Temp(Src) 97.9 F (36.6 C) (Oral)  Resp 18  Ht 5\' 7"  (1.702 m)  Wt 91.627 kg (202 lb)  BMI 31.63 kg/m2  SpO2 97%   FHT:  FHR: 140s bpm, variability: moderate,  accelerations:  Present,  decelerations:  Absent UC:   Not tracig well SVE:   Dilation: 3 Effacement (%): 60 Station: -2 Exam by:: Dr. Algie CofferFogelman AROM clear fluid  A / P:  27 y.o.  Obstetric History   G1   P0   T0   P0   A0   TAB0   SAB0   E0   M0   L0    at 7862w4d IOL due to gest htn, slow progress on pitocin, just now entering active phase. AROM done. HTN, pt has needed po and several IV doses of labetalol. Overall stable but needs close observation. Repeat PIH labs drawn now and pending. No abnl exam findings, no indciation for Mag  Fetal Wellbeing:  Category I Pain Control:  Epidural  Anticipated MOD:  NSVD  Sandra Brents A. Nija Koopman 04/19/2014, 4:03 AM  PE: No RUQ exam 1+ edema, 1+ DTR, no clonus  Pt reports no chest pain, no SOB  Elandra Powell A. Kharma Sampsel 04/19/2014 4:10 AM

## 2014-04-19 NOTE — Anesthesia Preprocedure Evaluation (Signed)
Anesthesia Evaluation  Patient identified by MRN, date of birth, ID band Patient awake    Reviewed: Allergy & Precautions, H&P , NPO status , Patient's Chart, lab work & pertinent test results  History of Anesthesia Complications Negative for: history of anesthetic complications  Airway Mallampati: II TM Distance: >3 FB Neck ROM: full    Dental no notable dental hx. (+) Teeth Intact   Pulmonary neg pulmonary ROS, former smoker,  breath sounds clear to auscultation  Pulmonary exam normal       Cardiovascular negative cardio ROS  Rhythm:regular Rate:Normal     Neuro/Psych negative neurological ROS  negative psych ROS   GI/Hepatic negative GI ROS, Neg liver ROS,   Endo/Other  negative endocrine ROS  Renal/GU negative Renal ROS  negative genitourinary   Musculoskeletal   Abdominal Normal abdominal exam  (+)   Peds  Hematology negative hematology ROS (+)   Anesthesia Other Findings   Reproductive/Obstetrics (+) Pregnancy                           Anesthesia Physical Anesthesia Plan  ASA: II  Anesthesia Plan: Epidural   Post-op Pain Management:    Induction:   Airway Management Planned:   Additional Equipment:   Intra-op Plan:   Post-operative Plan:   Informed Consent: I have reviewed the patients History and Physical, chart, labs and discussed the procedure including the risks, benefits and alternatives for the proposed anesthesia with the patient or authorized representative who has indicated his/her understanding and acceptance.     Plan Discussed with:   Anesthesia Plan Comments:         Anesthesia Quick Evaluation  

## 2014-04-20 LAB — COMPREHENSIVE METABOLIC PANEL
ALK PHOS: 102 U/L (ref 39–117)
ALT: 11 U/L (ref 0–35)
AST: 18 U/L (ref 0–37)
Albumin: 2.2 g/dL — ABNORMAL LOW (ref 3.5–5.2)
BILIRUBIN TOTAL: 0.2 mg/dL — AB (ref 0.3–1.2)
BUN: 4 mg/dL — ABNORMAL LOW (ref 6–23)
CHLORIDE: 101 meq/L (ref 96–112)
CO2: 24 mEq/L (ref 19–32)
Calcium: 8.7 mg/dL (ref 8.4–10.5)
Creatinine, Ser: 0.66 mg/dL (ref 0.50–1.10)
GFR calc Af Amer: >90 mL/min (ref >90)
GFR calc non Af Amer: >90 mL/min (ref >90)
Glucose, Bld: 89 mg/dL (ref 70–99)
POTASSIUM: 3.8 meq/L (ref 3.7–5.3)
Sodium: 137 mEq/L (ref 137–147)
Total Protein: 5.3 g/dL — ABNORMAL LOW (ref 6.0–8.3)

## 2014-04-20 LAB — CBC
HCT: 29.2 % — ABNORMAL LOW (ref 36.0–46.0)
Hemoglobin: 9.7 g/dL — ABNORMAL LOW (ref 12.0–15.0)
MCH: 29 pg (ref 26.0–34.0)
MCHC: 33.2 g/dL (ref 30.0–36.0)
MCV: 87.4 fL (ref 78.0–100.0)
PLATELETS: 266 10*3/uL (ref 150–400)
RBC: 3.34 MIL/uL — AB (ref 3.87–5.11)
RDW: 15.8 % — ABNORMAL HIGH (ref 11.5–15.5)
WBC: 16.1 10*3/uL — AB (ref 4.0–10.5)

## 2014-04-20 LAB — URIC ACID: Uric Acid, Serum: 5.3 mg/dL (ref 2.4–7.0)

## 2014-04-20 MED ORDER — POLYSACCHARIDE IRON COMPLEX 150 MG PO CAPS
150.0000 mg | ORAL_CAPSULE | Freq: Two times a day (BID) | ORAL | Status: DC
  Administered 2014-04-20 – 2014-04-21 (×3): 150 mg via ORAL
  Filled 2014-04-20 (×3): qty 1

## 2014-04-20 MED ORDER — MEASLES, MUMPS & RUBELLA VAC ~~LOC~~ INJ
0.5000 mL | INJECTION | Freq: Once | SUBCUTANEOUS | Status: AC
  Administered 2014-04-21: 0.5 mL via SUBCUTANEOUS
  Filled 2014-04-20 (×2): qty 0.5

## 2014-04-20 MED ORDER — PNEUMOCOCCAL VAC POLYVALENT 25 MCG/0.5ML IJ INJ
0.5000 mL | INJECTION | INTRAMUSCULAR | Status: AC
  Administered 2014-04-21: 0.5 mL via INTRAMUSCULAR
  Filled 2014-04-20: qty 0.5

## 2014-04-20 MED ORDER — LABETALOL HCL 100 MG PO TABS
100.0000 mg | ORAL_TABLET | Freq: Three times a day (TID) | ORAL | Status: DC
  Administered 2014-04-20 – 2014-04-21 (×3): 100 mg via ORAL
  Filled 2014-04-20 (×3): qty 1

## 2014-04-20 NOTE — Progress Notes (Signed)
PPD #1- SVD  Subjective:   Reports feeling well No HA, visual changes, or epigastric pain No SOB or CP Tolerating po/ No nausea or vomiting Bleeding is light Pain controlled with Motrin and Percocet Up ad lib / ambulatory / voiding without problems Newborn: breastfeeding    Objective:   VS:  VS:  Filed Vitals:   04/19/14 1445 04/19/14 1846 04/20/14 0230 04/20/14 0600  BP: 147/100 150/92 144/95 138/91  Pulse: 103 101 79 80  Temp: 98.6 F (37 C) 98.9 F (37.2 C) 97.3 F (36.3 C) 97.6 F (36.4 C)  TempSrc: Axillary Oral Oral   Resp: 18 20 18 20   Height:      Weight:      SpO2:   97%     LABS:  Recent Labs  04/19/14 0155 04/20/14 0630  WBC 12.9* 16.1*  HGB 10.5* 9.7*  PLT 252 266   Blood type: --/--/O POS, O POS (05/01 1600) Rubella: Equivocal (10/13 0000)   I&O: Intake/Output     05/02 0701 - 05/03 0700 05/03 0701 - 05/04 0700   Urine (mL/kg/hr) 2500 (1.1)    Blood 350 (0.2)    Total Output 2850     Net -2850            Physical Exam: Alert and oriented x3 Abdomen: soft, non-tender, non-distended  Fundus: firm, non-tender, U-1 Perineum: Well approximated, no significant erythema, edema, or drainage; healing well. Lochia: small Extremities: trace edema to BLE, no calf pain or tenderness    Assessment:  PPD # 1G1P1001/ S/P:induced vaginal, 1st degree laceration IDA with compounding ABL anemia Gestational HTN, delivered  Doing well    Plan: Continue routine post partum orders Monitor BPs closely, consider starting Labetalol Start Niferex Anticipate D/C home tomorrow   Lawernce PittsMelanie N Baylei Siebels MSN, CNM 04/20/2014, 10:58 AM

## 2014-04-20 NOTE — Lactation Note (Signed)
This note was copied from the chart of Shelby Love. Lactation Consultation Note  Patient Name: Shelby Denyce RobertMiesha Kallman ZOXWR'UToday's Date: 04/20/2014 Reason for consult: Initial assessment  Mom had baby latched in cradle hold when I arrived. Adjusted baby to obtain more depth with latch. Some compression noted when baby came off the breast. Reviewed positioning and how to obtain more depth with latch. Mom denies discomfort at this time. BF basics reviewed with Mom. Lactation brochure left for review, advised of OP services and support group. Encouraged to call for assist as needed.   Maternal Data Formula Feeding for Exclusion: No Infant to breast within first hour of birth: Yes Has patient been taught Hand Expression?: Yes Does the patient have breastfeeding experience prior to this delivery?: No  Feeding Feeding Type: Breast Fed Length of feed: 10 min  LATCH Score/Interventions                      Lactation Tools Discussed/Used WIC Program: No   Consult Status Consult Status: Follow-up Date: 04/21/14 Follow-up type: In-patient    Shelby Love 04/20/2014, 7:17 PM

## 2014-04-20 NOTE — Anesthesia Postprocedure Evaluation (Signed)
Anesthesia Post Note  Patient: Shelby Love  Procedure(s) Performed: * No procedures listed *  Anesthesia type: Epidural  Patient location: Mother/Baby  Post pain: Pain level controlled  Post assessment: Post-op Vital signs reviewed  Last Vitals:  Filed Vitals:   04/20/14 0600  BP: 138/91  Pulse: 80  Temp: 36.4 C  Resp: 20    Post vital signs: Reviewed  Level of consciousness:alert  Complications: No apparent anesthesia complications

## 2014-04-21 MED ORDER — LABETALOL HCL 100 MG PO TABS: 100.0000 mg | ORAL_TABLET | Freq: Three times a day (TID) | ORAL | Status: DC

## 2014-04-21 MED ORDER — POLYSACCHARIDE IRON COMPLEX 150 MG PO CAPS: 150.0000 mg | ORAL_CAPSULE | Freq: Two times a day (BID) | ORAL | Status: DC

## 2014-04-21 MED ORDER — OXYCODONE-ACETAMINOPHEN 5-325 MG PO TABS: 1.0000 | ORAL_TABLET | Freq: Four times a day (QID) | ORAL | Status: DC | PRN

## 2014-04-21 MED ORDER — SENNOSIDES-DOCUSATE SODIUM 8.6-50 MG PO TABS: 2.0000 | ORAL_TABLET | Freq: Every evening | ORAL | Status: DC | PRN

## 2014-04-21 MED ORDER — IBUPROFEN 600 MG PO TABS: 600.0000 mg | ORAL_TABLET | Freq: Four times a day (QID) | ORAL | Status: DC | PRN

## 2014-04-21 NOTE — Lactation Note (Signed)
This note was copied from the chart of Shelby Emerie Douthitt. Lactation Consultation Note  Patient Name: Shelby Love ZOXWR'UToday's Date: 04/21/2014   Visited with Mom on day of discharge, baby at 7047 hrs old.  Mom states that feedings are going well.  Some soreness, and slight blistering on nipple tips.  Recommended she work hard on getting baby on deeper to the breast, tips given.  Encouraged manual breast expression after feedings to apply EBM to nipple tip.  Pacifier in crib.  Talked about risks of using a pacifier in the first few weeks.  Recommended skin to skin, and feeding baby often on cue.  Engorgement prevention and treatment discussed.  Information on OP Lactation services discussed.  To call us prn.    Shelby Love 04/21/2014, 10:56 AM

## 2014-04-21 NOTE — Discharge Summary (Signed)
Obstetric Discharge Summary   Reason for Admission: Pt is a G1 P0 at 33w4dwith IOL due to worsening gestational hypertension. Pt notes occasional contractions . Good fetal movement, No vaginal bleeding, not leaking fluid. No significant headache, no vision change, no right upper quadrant pain.  [facility] [place] FP[place]ala Hurryas primary provider.  Medications on Admission: Prescriptions prior to admission  Medication Sig Dispense Refill  . acetaminophen (TYLENOL) 500 MG tablet Take 1,000 mg by mouth every 6 (six) hours as needed for mild pain or headache.      .Marland KitchenFOLIC ACID PO Take 1 tablet by mouth daily.      . metroNIDAZOLE (METROGEL) 0.75 % gel Place 1 application vaginally at bedtime. Use for 5 nights for bacterial infection.  Started on 04-16-14      . Prenatal Vit-Fe Fumarate-FA (PRENATAL MULTIVITAMIN) TABS tablet Take 1 tablet by mouth daily at 12 noon.        Prenatal Labs: ABO, Rh: O Pos Antibody: NEG (05/01 1600) Rubella: Equivocal (10/13 0000)  / MMR booster prior to d/c from hospital RPR: NON REAC (05/01 1600)  HBsAg: Negative (10/13 0000)  HIV: Non-reactive (10/13 0000)  GTT : 3hr WNL GBS: Negative (04/09 0000)   Prenatal Procedures: NST and ultrasound Intrapartum Procedures: spontaneous vaginal delivery Postpartum Procedures: none Complications-Operative and Postpartum: 1st  degree perineal laceration  Labs: Hemoglobin  Date Value Ref Range Status  04/20/2014 9.7* 12.0 - 15.0 g/dL Final     HCT  Date Value Ref Range Status  04/20/2014 29.2* 36.0 - 46.0 % Final   Lab Results  Component Value Date   PLT 266 04/20/2014    Newborn Data: Live born female  Birth Weight: 7 lb 9 oz (3430 g) APGAR: 9, 9  Home with mother.   Discharge Information: Date: 04/21/2014 Discharge Diagnoses:  Pt is a G1P1001 at 349w4d/P SVD w/1st deg repair on 04/19/14     Hx gestHTN, now with elevated BP's and labetalol started.  Asymptomatic  and labs WNL.  Will see pt in office this week     Hx chronic IDA, now compounded by ABL Anemia  Condition: stable Activity: pelvic rest Diet: routine Medications:    Medication List         acetaminophen 500 MG tablet  Commonly known as:  TYLENOL  Take 1,000 mg by mouth every 6 (six) hours as needed for mild pain or headache.     FOLIC ACID PO  Take 1 tablet by mouth daily.     ibuprofen 600 MG tablet  Commonly known as:  ADVIL,MOTRIN  Take 1 tablet (600 mg total) by mouth every 6 (six) hours as needed.     iron polysaccharides 150 MG capsule  Commonly known as:  NIFEREX  Take 1 capsule (150 mg total) by mouth 2 (two) times daily.     labetalol 100 MG tablet  Commonly known as:  NORMODYNE  Take 1 tablet (100 mg total) by mouth every 8 (eight) hours.     metroNIDAZOLE 0.75 % gel  Commonly known as:  MEDouble Oak application vaginally at bedtime. Use for 5 nights for bacterial infection.  Started on 04-16-14     oxyCODONE-acetaminophen 5-325 MG per tablet  Commonly known as:  PERCOCET/ROXICET  Take 1-2 tablets by mouth every 6 (six) hours as needed for severe pain (moderate - severe pain).     prenatal multivitamin Tabs tablet  Take 1  tablet by mouth daily at 12 noon.     senna-docusate 8.6-50 MG per tablet  Commonly known as:  Senokot-S  Take 2 tablets by mouth at bedtime as needed for mild constipation.       Instructions: The [facility] OB/GYN instruction booklet has been given and reviewed Discharge to: home Follow-up Information   Follow up with [facility] MD In 6 weeks.   Specialty:  Obstetrics and Gynecology   Contact information:   [place] [id number] [address], MSN, [facility] 04/21/2014, 9:43 AM

## 2014-04-21 NOTE — Progress Notes (Signed)
Patient ID: Shelby Love, female   DOB: 12-Aug-1986, 28 y.o.   MRN: [id number] PPD # 2  Subjective: Pt reports feeling well and eager for d/c home / Pain controlled with ibuprofen and rare percocet Denies epigastric pain, SOB, CP, scotoma, HA Tolerating po/ Voiding without problems/ No n/v Bleeding is light/ Newborn info:  Information for the patient's newborn:  Love, [name] [[id number]]  female Feeding: breast    Objective:  VS: Blood pressure 136/83, pulse 73, temperature 98 F (36.7 C), temperature source Oral, resp. rate 18   Recent Labs  04/19/14 0155 04/20/14 0630  WBC 12.9* 16.1*  HGB 10.5* 9.7*  HCT 30.8* 29.2*  PLT 252 266   Uric Acid: 5.3 AST/ALT: 18/11  Blood type: O POS Rubella: Equivocal; MMR prior to d/c home    Physical Exam:  General:  alert, cooperative and no distress CV: Regular rate and rhythm Resp: clear Abdomen: soft, nontender, normal bowel sounds Uterine Fundus: firm, below umbilicus, nontender Perineum: healing with good reapproximation Lochia: minimal Ext: +1 pedal and pretib    A/P: PPD # 2/ G1P1001/ S/P:SVD w 2nd deg laceration Hx gestHTN with elevated BP's now.  Labetalol started. Chronic IDA, compounded by ABL anemia Stable for discharge home RX: Labetalol 162m Q8 hrs Ibuprofen 6038mpo Q 6 hrs prn pain #30 Refill x 1 Niferex 15058mo BID /#60 Refill x 1 Percocet 5/325 1 to 2 po Q 4 hrs prn pain #15 No refill Colace 100m68m up to TID prn #30 Ref x 1 Recheck BP in the office on Friday 04/25/14 Reportable symptoms of HA, blurred vision or visual changes, epigastric pain, CP, SOB reviewed. WOB/GYN booklet given Routine pp visit in 6wksBraggsN, [facility]/2015, 8:51 AM

## 2014-04-22 NOTE — Discharge Summary (Signed)
Please note, primary provider Dr. Seymour BarsLavoie. Pt admitted with gest htn, labs and eval neg for PEC. Pt did need several doses oral and IV labetalol intrapartum for bp control. Htn stabilized PP and plan for close outpt f/u.  Tresa EndoKelly A. Yadier Bramhall 04/22/2014 8:59 PM

## 2014-10-20 ENCOUNTER — Encounter (HOSPITAL_COMMUNITY): Payer: Self-pay | Admitting: *Deleted

## 2017-12-19 NOTE — L&D Delivery Note (Signed)
Delivery Note Pt reached complete - pushed very well for about 10min.  At 10:30 AM a viable and healthy female was delivered via Vaginal, Spontaneous (Presentation: OA; ROT  ).  APGAR: 9, 9; weight P .   Placenta status: delivered, intact .  Cord: 3V  with the following complications:nuchal x 1.    Anesthesia:  none Episiotomy:  none Lacerations: Labial Suture Repair: 3.0 vicryl rapide Est. Blood Loss (mL):  150cc  Mom to postpartum.  Baby to Couplet care / Skin to Skin.  Shelby Love 07/27/2018, 10:48 AM  D/w pt r/b/a of circumcision wish to proceed in office - will call to set up  O+/Br/Contra?Rachelle Hora/[place]

## 2018-04-03 ENCOUNTER — Encounter (HOSPITAL_COMMUNITY): Payer: Self-pay

## 2018-04-24 ENCOUNTER — Other Ambulatory Visit (HOSPITAL_COMMUNITY): Payer: Self-pay | Admitting: Obstetrics and Gynecology

## 2018-04-24 DIAGNOSIS — R9389 Abnormal findings on diagnostic imaging of other specified body structures: Secondary | ICD-10-CM

## 2018-04-24 DIAGNOSIS — Z3689 Encounter for other specified antenatal screening: Secondary | ICD-10-CM

## 2018-04-24 DIAGNOSIS — Z3A29 29 weeks gestation of pregnancy: Secondary | ICD-10-CM

## 2018-04-25 ENCOUNTER — Other Ambulatory Visit (HOSPITAL_COMMUNITY): Payer: Self-pay | Admitting: *Deleted

## 2018-04-25 ENCOUNTER — Ambulatory Visit (HOSPITAL_COMMUNITY)
Admission: RE | Admit: 2018-04-25 | Discharge: 2018-04-25 | Disposition: A | Discharge status: Home / Self Care | Payer: 59 | Source: Ambulatory Visit | Attending: Obstetrics and Gynecology | Admitting: Obstetrics and Gynecology

## 2018-04-25 ENCOUNTER — Encounter (HOSPITAL_COMMUNITY): Payer: Self-pay | Admitting: *Deleted

## 2018-04-25 DIAGNOSIS — O283 Abnormal ultrasonic finding on antenatal screening of mother: Secondary | ICD-10-CM | POA: Diagnosis present

## 2018-04-25 DIAGNOSIS — O359XX0 Maternal care for (suspected) fetal abnormality and damage, unspecified, not applicable or unspecified: Secondary | ICD-10-CM | POA: Insufficient documentation

## 2018-04-25 DIAGNOSIS — Z3A25 25 weeks gestation of pregnancy: Secondary | ICD-10-CM | POA: Insufficient documentation

## 2018-04-25 DIAGNOSIS — O35EXX0 Maternal care for other (suspected) fetal abnormality and damage, fetal genitourinary anomalies, not applicable or unspecified: Secondary | ICD-10-CM

## 2018-04-25 DIAGNOSIS — Z3689 Encounter for other specified antenatal screening: Secondary | ICD-10-CM | POA: Insufficient documentation

## 2018-04-25 DIAGNOSIS — O09292 Supervision of pregnancy with other poor reproductive or obstetric history, second trimester: Secondary | ICD-10-CM | POA: Insufficient documentation

## 2018-04-25 DIAGNOSIS — Z3A29 29 weeks gestation of pregnancy: Secondary | ICD-10-CM

## 2018-04-25 DIAGNOSIS — R9389 Abnormal findings on diagnostic imaging of other specified body structures: Secondary | ICD-10-CM

## 2018-04-25 DIAGNOSIS — O358XX0 Maternal care for other (suspected) fetal abnormality and damage, not applicable or unspecified: Secondary | ICD-10-CM

## 2018-05-07 ENCOUNTER — Ambulatory Visit: Payer: 59 | Admitting: Physical Therapy

## 2018-05-16 ENCOUNTER — Ambulatory Visit: Payer: 59 | Attending: Obstetrics and Gynecology | Admitting: Physical Therapy

## 2018-05-16 ENCOUNTER — Encounter: Payer: Self-pay | Admitting: Physical Therapy

## 2018-05-16 ENCOUNTER — Other Ambulatory Visit: Payer: Self-pay

## 2018-05-16 DIAGNOSIS — R262 Difficulty in walking, not elsewhere classified: Secondary | ICD-10-CM | POA: Insufficient documentation

## 2018-05-16 DIAGNOSIS — M5442 Lumbago with sciatica, left side: Secondary | ICD-10-CM | POA: Insufficient documentation

## 2018-05-16 DIAGNOSIS — M6281 Muscle weakness (generalized): Secondary | ICD-10-CM | POA: Insufficient documentation

## 2018-05-16 DIAGNOSIS — R293 Abnormal posture: Secondary | ICD-10-CM | POA: Diagnosis present

## 2018-05-16 DIAGNOSIS — G8929 Other chronic pain: Secondary | ICD-10-CM | POA: Insufficient documentation
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
## 2018-05-23 ENCOUNTER — Other Ambulatory Visit (HOSPITAL_COMMUNITY): Payer: Self-pay | Admitting: Obstetrics and Gynecology

## 2018-05-23 ENCOUNTER — Ambulatory Visit: Payer: 59 | Attending: Obstetrics and Gynecology | Admitting: Physical Therapy

## 2018-05-23 ENCOUNTER — Ambulatory Visit (HOSPITAL_COMMUNITY)
Admission: RE | Admit: 2018-05-23 | Discharge: 2018-05-23 | Disposition: A | Discharge status: Home / Self Care | Payer: 59 | Source: Ambulatory Visit | Attending: Obstetrics and Gynecology | Admitting: Obstetrics and Gynecology

## 2018-05-23 ENCOUNTER — Encounter (HOSPITAL_COMMUNITY): Payer: Self-pay

## 2018-05-23 DIAGNOSIS — O358XX0 Maternal care for other (suspected) fetal abnormality and damage, not applicable or unspecified: Secondary | ICD-10-CM

## 2018-05-23 DIAGNOSIS — O359XX0 Maternal care for (suspected) fetal abnormality and damage, unspecified, not applicable or unspecified: Secondary | ICD-10-CM

## 2018-05-23 DIAGNOSIS — R262 Difficulty in walking, not elsewhere classified: Secondary | ICD-10-CM | POA: Insufficient documentation

## 2018-05-23 DIAGNOSIS — O09299 Supervision of pregnancy with other poor reproductive or obstetric history, unspecified trimester: Secondary | ICD-10-CM

## 2018-05-23 DIAGNOSIS — Z3A29 29 weeks gestation of pregnancy: Secondary | ICD-10-CM | POA: Diagnosis not present

## 2018-05-23 DIAGNOSIS — R293 Abnormal posture: Secondary | ICD-10-CM

## 2018-05-23 DIAGNOSIS — O09293 Supervision of pregnancy with other poor reproductive or obstetric history, third trimester: Secondary | ICD-10-CM | POA: Diagnosis not present

## 2018-05-23 DIAGNOSIS — M5442 Lumbago with sciatica, left side: Secondary | ICD-10-CM | POA: Insufficient documentation

## 2018-05-23 DIAGNOSIS — M6281 Muscle weakness (generalized): Secondary | ICD-10-CM | POA: Diagnosis present

## 2018-05-23 DIAGNOSIS — Z362 Encounter for other antenatal screening follow-up: Secondary | ICD-10-CM | POA: Insufficient documentation

## 2018-05-23 DIAGNOSIS — G8929 Other chronic pain: Secondary | ICD-10-CM | POA: Diagnosis present

## 2018-05-23 DIAGNOSIS — O35EXX0 Maternal care for other (suspected) fetal abnormality and damage, fetal genitourinary anomalies, not applicable or unspecified: Secondary | ICD-10-CM

## 2018-05-23 NOTE — Therapy (Signed)
[facility] [address]  [address] [place], [place], [address] Phone: [phone]   Fax:  [phone]  Physical Therapy Treatment  Patient Details  Name: Shelby RobertMiesha Love MRN: [id number] Date of Birth: 1986-07-31 Referring Provider: Pryor Ochoaecilia Banga, DO   Encounter Date: 05/23/2018  PT End of Session - 05/23/18 0933    Visit Number  2    Number of Visits  7    Date for PT Re-Evaluation  06/27/18    Authorization Type  UHC    PT Start Time  0857    PT Stop Time  0931    PT Time Calculation (min)  34 min    Activity Tolerance  Patient tolerated treatment well    Behavior During Therapy  [facility] tasks assessed/performed       Past Medical History:  Diagnosis Date  . Medical history non-contributory     No past surgical history on file.  There were no vitals filed for this visit.  Subjective Assessment - 05/23/18 0858    Subjective  Patient reports back is about the same. Stretches at home have been feeling good. Still feeling pressure into L leg when sitting.     Patient Stated Goals  getting to pain free    Currently in Pain?  Yes    Pain Score  4     Pain Location  Back    Pain Orientation  Left;Lower    Pain Descriptors / Indicators  Sore    Pain Type  Chronic pain                       [facility] Adult PT Treatment/Exercise - 05/23/18 0001      Exercises   Exercises  Lumbar      Lumbar Exercises: Stretches   Passive Hamstring Stretch  2 reps;30 seconds each LE with strap    Figure 4 Stretch  Seated;With overpressure;2 reps;20 seconds      Lumbar Exercises: Standing   Other Standing Lumbar Exercises  mini squat at counter top; 15x VCs to keep spine neutral      Lumbar Exercises: Seated   Other Seated Lumbar Exercises  sciatic glide with cervical extension; 10x L LE    Other Seated Lumbar Exercises  pelvic tilt ant/pos; 15x VCs to decrease speed      Lumbar Exercises: Supine   Other Supine Lumbar  Exercises  TrA hooklying 10x10" heavy VC/TCs for form; pt with L LBP in supine      Lumbar Exercises: Quadruped   [place]/Old Horse  15 reps VCs for breaths    Other Quadruped Lumbar Exercises  pelvic tilts R/L tail wag 10x each side    Other Quadruped Lumbar Exercises  child pose w/ B UEs on 3 pillows; 5x hold 5-10 sec             PT Education - 05/23/18 0932    Education provided  Yes    Education Details  Edu on TrA contraction- given handout, use of SI belt during pregnancy    Person(s) Educated  Patient    Methods  Explanation;Demonstration;Tactile cues;Verbal cues;Handout    Comprehension  Verbalized understanding;Returned demonstration       PT Short Term Goals - 05/23/18 [id number]      PT SHORT TERM GOAL #1   Title  Patient to be independent with initial HEP.    Time  3    Period  Weeks  Status  Achieved        PT Long Term Goals - 05/16/18 1232      PT LONG TERM GOAL #1   Title  Patient to be independent with advanced HEP.    Time  6    Period  Weeks    Status  New    Target Date  06/27/18      PT LONG TERM GOAL #2   Title  Patient to demonstrate >=4+/5 strength in B LEs.    Time  6    Period  Weeks    Status  New    Target Date  06/27/18      PT LONG TERM GOAL #3   Title  Patient to perform picking up object off floor with <=1/10 pain.    Time  6    Period  Weeks    Status  New    Target Date  06/27/18      PT LONG TERM GOAL #4   Title  Patient to report tolerance of 1.5 hours of sitting at work without pain limiting.     Time  6    Period  Weeks    Status  New    Target Date  06/27/18            Plan - 05/23/18 0933    Clinical Impression Statement  Patient arrived late to appointment. Reports that she continues to have LBP but that HEP exercises feel good. Still having trouble with TrA contraction. Patient with limited tolerance and report of L sided LBP with hookyling positioning; heavy TC/VCs to elicit contraction. Discontinued  hooklying positioning d/t pain. Patient received handout on TrA contraction to help with understanding and provide additional cues. Able to perform gently quadruped and sitting spinal ROM and stretching with intermittent c/o pain but tolerable. Educated patient on use of SI belt for pregnancy- spoke with patient on activity, frequency, and duration of wear. Advised to bring belt to next session to practice donning/doffing. Patient reported understanding.     PT Treatment/Interventions  ADLs/Self Care Home Management;Cryotherapy;Moist Heat;Gait training;Stair training;Functional mobility training;Therapeutic activities;Therapeutic exercise;Manual techniques;Patient/family education;Neuromuscular re-education;Balance training;Passive range of motion;Energy conservation;Taping;Splinting    PT Next Visit Plan  application of SI belt, kinesiotape    Consulted and Agree with Plan of Care  Patient       Patient will benefit from skilled therapeutic intervention in order to improve the following deficits and impairments:  Decreased activity tolerance, Decreased strength, Pain, Difficulty walking, Decreased range of motion, Postural dysfunction  Visit Diagnosis: Chronic midline low back pain with left-sided sciatica  Muscle weakness (generalized)  Difficulty in walking, not elsewhere classified  Abnormal posture     Problem List Patient Active Problem List   Diagnosis Date Noted  . Postpartum care following vaginal delivery (5/2) 04/19/2014  . Gestational hypertension 04/18/2014     Anette Guarneri, PT, DPT 05/23/18 9:39 AM   [facility] [address]  [address] [place], [place], [address] Phone: [phone]   Fax:  [phone]  Name: Shelby Love MRN: [id number] Date of Birth: 01/13/1986

## 2018-05-30 ENCOUNTER — Ambulatory Visit: Payer: 59 | Admitting: Physical Therapy

## 2018-06-06 ENCOUNTER — Ambulatory Visit: Payer: 59 | Admitting: Physical Therapy

## 2018-06-06 ENCOUNTER — Encounter: Payer: Self-pay | Admitting: Physical Therapy

## 2018-06-06 DIAGNOSIS — M5442 Lumbago with sciatica, left side: Secondary | ICD-10-CM | POA: Diagnosis not present

## 2018-06-06 DIAGNOSIS — R293 Abnormal posture: Secondary | ICD-10-CM

## 2018-06-06 DIAGNOSIS — M6281 Muscle weakness (generalized): Secondary | ICD-10-CM

## 2018-06-06 DIAGNOSIS — R262 Difficulty in walking, not elsewhere classified: Secondary | ICD-10-CM

## 2018-06-06 DIAGNOSIS — G8929 Other chronic pain: Secondary | ICD-10-CM

## 2018-06-06 NOTE — Therapy (Signed)
[facility] [address]  [address] [place], [place], [address] Phone: [phone]   Fax:  [phone]  Physical Therapy Treatment  Patient Details  Name: Shelby Love MRN: [id number] Date of Birth: 10/18/1986 Referring Provider: Pryor Ochoa, DO   Encounter Date: 06/06/2018  PT End of Session - 06/06/18 1056    Visit Number  3    Number of Visits  7    Date for PT Re-Evaluation  06/27/18    Authorization Type  UHC    PT Start Time  1020    PT Stop Time  1054    PT Time Calculation (min)  34 min    Activity Tolerance  Patient tolerated treatment well    Behavior During Therapy  [facility] for tasks assessed/performed       Past Medical History:  Diagnosis Date  . Medical history non-contributory     History reviewed. No pertinent surgical history.  There were no vitals filed for this visit.  Subjective Assessment - 06/06/18 1022    Subjective  Patient reports she has been good and had had more better days since last treatment. Slept wrong 2 nights in a row and neck has been hurting. Ordered an SI belt and started natural childbirth classes. Had trouble crossing legs.     Patient Stated Goals  getting to pain free    Currently in Pain?  Yes    Pain Score  5     Pain Location  Back    Pain Orientation  Left;Lower    Pain Descriptors / Indicators  Clance Boll Adult PT Treatment/Exercise - 06/06/18 0001      Lumbar Exercises: Stretches   Passive Hamstring Stretch  Right;Left;2 reps;20 seconds;Limitations    Passive Hamstring Stretch Limitations  sitting on table, with strap      Lumbar Exercises: Standing   Functional Squats  10 reps;Limitations    Functional Squats Limitations  B UEs at counter top VCs to bring hips back    Other Standing Lumbar Exercises  sidestepping red TB at forefoot with B UEs at counter top; 4x length of counter top      Lumbar Exercises: Seated   Other  Seated Lumbar Exercises  TrA contraction 10x10"; 10x with alt LE march VCs for activation    Other Seated Lumbar Exercises  pelvic tilt ant/pos; 15x      Lumbar Exercises: Quadruped   Single Arm Raise  5 reps;Left;Right VC/TCs to keep spine neutral    Straight Leg Raise  5 reps;Limitations    Straight Leg Raises Limitations  VCs to keep spine neutral    Other Quadruped Lumbar Exercises  pelvic tilts R/L tail wag 10x each side             PT Education - 06/06/18 1055    Education provided  Yes    Education Details  Addition to HEP for exercises at counter top for safety    Person(s) Educated  Patient    Methods  Explanation;Demonstration;Tactile cues;Verbal cues;Handout    Comprehension  Returned demonstration;Verbalized understanding       PT Short Term Goals - 05/23/18 0937      PT SHORT TERM GOAL #1   Title  Patient to be independent with initial HEP.    Time  3    Period  Weeks  Status  Achieved        PT Long Term Goals - 05/16/18 1232      PT LONG TERM GOAL #1   Title  Patient to be independent with advanced HEP.    Time  6    Period  Weeks    Status  New    Target Date  06/27/18      PT LONG TERM GOAL #2   Title  Patient to demonstrate >=4+/5 strength in B LEs.    Time  6    Period  Weeks    Status  New    Target Date  06/27/18      PT LONG TERM GOAL #3   Title  Patient to perform picking up object off floor with <=1/10 pain.    Time  6    Period  Weeks    Status  New    Target Date  06/27/18      PT LONG TERM GOAL #4   Title  Patient to report tolerance of 1.5 hours of sitting at work without pain limiting.     Time  6    Period  Weeks    Status  New    Target Date  06/27/18            Plan - 06/06/18 1056    Clinical Impression Statement  Patient arrived to appointment with report that she has had an increase in "better days" since last treatment session but has not been as active d/t family medical issues. Came in with SI belt  today but had not yet used it.  Able to tolerable sitting TrA contraction with addition of LE marching this date with better carryover of TrA activation. Patient reported fatigue after quadruped arm/leg raise. Given sitting rest break and given water. Patient able to perform squats and side stepping with banded resistance at feet at counter top- patient with good form. Added these exercises to HEP; patient reported understanding. Educated patient on donning/doffing SI belt to patient's comfort- advised not to pull straps too tight to avoid excessive compression on belly. Patient reported understanding.     PT Treatment/Interventions  ADLs/Self Care Home Management;Cryotherapy;Moist Heat;Gait training;Stair training;Functional mobility training;Therapeutic activities;Therapeutic exercise;Manual techniques;Patient/family education;Neuromuscular re-education;Balance training;Passive range of motion;Energy conservation;Taping;Splinting    PT Next Visit Plan  progress standing hip strengthening     Consulted and Agree with Plan of Care  Patient       Patient will benefit from skilled therapeutic intervention in order to improve the following deficits and impairments:  Decreased activity tolerance, Decreased strength, Pain, Difficulty walking, Decreased range of motion, Postural dysfunction  Visit Diagnosis: Chronic midline low back pain with left-sided sciatica  Muscle weakness (generalized)  Difficulty in walking, not elsewhere classified  Abnormal posture     Problem List Patient Active Problem List   Diagnosis Date Noted  . Postpartum care following vaginal delivery (5/2) 04/19/2014  . Gestational hypertension 04/18/2014    Anette GuarneriYevgeniya Yael Coppess, PT, DPT 06/06/18 12:01 PM   [facility] [place] [address] [place] [address]  [address] [place], [place], [address] Phone: [phone]   Fax:  [phone]  Name: Shelby Love MRN: [id number] Date of Birth:  05-03-1986

## 2018-06-13 ENCOUNTER — Ambulatory Visit: Payer: 59 | Admitting: Physical Therapy

## 2018-06-13 ENCOUNTER — Encounter: Payer: Self-pay | Admitting: Physical Therapy

## 2018-06-13 DIAGNOSIS — R293 Abnormal posture: Secondary | ICD-10-CM

## 2018-06-13 DIAGNOSIS — M5442 Lumbago with sciatica, left side: Principal | ICD-10-CM

## 2018-06-13 DIAGNOSIS — M6281 Muscle weakness (generalized): Secondary | ICD-10-CM

## 2018-06-13 DIAGNOSIS — G8929 Other chronic pain: Secondary | ICD-10-CM

## 2018-06-13 DIAGNOSIS — R262 Difficulty in walking, not elsewhere classified: Secondary | ICD-10-CM

## 2018-06-13 NOTE — Therapy (Signed)
[facility] [address]  [address] [place], [place], [address] Phone: [phone]   Fax:  [phone]  Physical Therapy Treatment  Patient Details  Name: Nylia Gavina MRN: [id number] Date of Birth: [birth year] Referring Provider: Pryor Ochoa, DO   Encounter Date: 06/13/2018  PT End of Session - 06/13/18 1225    Visit Number  4    Number of Visits  7    Date for PT Re-Evaluation  06/27/18    Authorization Type  UHC    PT Start Time  1020    PT Stop Time  1058    PT Time Calculation (min)  38 min    Activity Tolerance  Patient tolerated treatment well;Patient limited by fatigue    Behavior During Therapy  [facility] for tasks assessed/performed       Past Medical History:  Diagnosis Date  . Medical history non-contributory     History reviewed. No pertinent surgical history.  There were no vitals filed for this visit.  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 Reports radiating pain down L side. Feels better when she is at home than at work. Was wearing SI belt but it was stimulating braxton hicks contractions so she stopped wearing. Also got birthing ball and that felt good to sit on it.     Patient Stated Goals  getting to pain free    Currently in Pain?  Yes    Pain Score  7     Pain Location  Back    Pain Orientation  Left;Lower    Pain Descriptors / Indicators  Clance Boll Adult PT Treatment/Exercise - 06/13/18 0001      Lumbar Exercises: Standing   Other Standing Lumbar Exercises  mini squat at counter top; 2x10 10x, 10x with red TB around knees    Other Standing Lumbar Exercises  sidestepping red TB at forefoot with B UEs at counter top; 4x length of counter top      Lumbar Exercises: Supine   Pelvic Tilt  10 reps;Limitations    Pelvic Tilt Limitations  sitting on green pball; TCs/TCs to increase movement     Bent Knee Raise  10 reps;Limitations    Bent Knee Raise Limitations  sitting on pball alt arm/leg lift with CGA    Other Supine Lumbar Exercises  sitting pball pallof press with red TB 10x each      Lumbar Exercises: Quadruped   [place]  10 reps TCs to increase pelvic movement    Other Quadruped Lumbar Exercises  pelvic tilts R/L tail wag 10x each side      Manual Therapy   Manual Therapy  Passive ROM    Manual therapy comments  self-STM to L buttock x1 min    Passive ROM  R hip flexor stretch in mod thomas with PT OP; 2x20 sec each unable to tolerate L LE d/t pain             PT Education - 06/13/18 1223    Education provided  Yes    Education Details  edu on self-STM to L buttock for relief of symptoms and advised to discontinue if noticing increased contractions; advised to disconinue use of SI belt d/t increased contractions per patient's report    Person(s) Educated  Patient    Methods  Explanation;Demonstration    Comprehension  Verbalized understanding;Returned demonstration       PT Short Term Goals - 05/23/18 1610      PT SHORT TERM GOAL #1   Title  Patient to be independent with initial HEP.    Time  3    Period  Weeks    Status  Achieved        PT Long Term Goals - 05/16/18 1232      PT LONG TERM GOAL #1   Title  Patient to be independent with advanced HEP.    Time  6    Period  Weeks    Status  New    Target Date  06/27/18      PT LONG TERM GOAL #2   Title  Patient to demonstrate >=4+/5 strength in B LEs.    Time  6    Period  Weeks    Status  New    Target Date  06/27/18      PT LONG TERM GOAL #3   Title  Patient to perform picking up object off floor with <=1/10 pain.    Time  6    Period  Weeks    Status  New    Target Date  06/27/18      PT LONG TERM GOAL #4   Title  Patient to report tolerance of 1.5 hours of sitting at work without pain limiting.     Time  6    Period  Weeks    Status  New    Target Date  06/27/18             Plan - 06/13/18 1226    Clinical Impression Statement  Patient arrived to session with report that she noticed increased L LE pain after working- reports symptoms are improved when at home as she is able to stretch more throughout the day. Encouraged patient to continue sitting or standing extension exercises as needed for relief of pain. Patient also reported she got a birthing ball at her class and reports relief with this. Focused on pelvic ROM and core stability exercises sitting on pball this session. Patient tolerated hip flexor stretch with relief of tension on R LE, unable to tolerate this on L LE d/t back/buttock pain. Patient tolerated all other hip strengthening exercises without pain- limited by fatigue and required sitting rest breaks d/t fatigue. Patient educated on self-STM with ball on wall to L buttock for relief of symptoms and advised to discontinue if noticing increased contractions; advised to discontinue use of SI belt d/t increased contractions per patient's report. Patient reported understanding.     PT Treatment/Interventions  ADLs/Self Care Home Management;Cryotherapy;Moist Heat;Gait training;Stair training;Functional mobility training;Therapeutic activities;Therapeutic exercise;Manual techniques;Patient/family education;Neuromuscular re-education;Balance training;Passive range of motion;Energy conservation;Taping;Splinting    PT Next Visit Plan  progress core stability exercises     Consulted and Agree with Plan of Care  Patient       Patient will benefit from skilled therapeutic intervention in order to improve the following deficits and impairments:  Decreased activity tolerance, Decreased strength, Pain, Difficulty walking, Decreased range of motion, Postural dysfunction  Visit Diagnosis: Chronic midline low back pain with left-sided sciatica  Muscle weakness (generalized)  Abnormal posture  Difficulty in walking, not elsewhere  classified     Problem List Patient Active Problem List   Diagnosis Date Noted  . Postpartum care following vaginal delivery (5/2) 04/19/2014  . Gestational hypertension 04/18/2014  Anette GuarneriYevgeniya Kovalenko, PT, DPT 06/13/18 12:32 PM  [facility] [place] [address]  [address] [place], [place], [address] Phone: [phone]   Fax:  [phone]  Name: Denyce RobertMiesha Wenig MRN: [id number] Date of Birth: 10-03-1986

## 2018-06-20 ENCOUNTER — Ambulatory Visit: Payer: 59 | Attending: Obstetrics and Gynecology | Admitting: Physical Therapy

## 2018-06-20 DIAGNOSIS — R262 Difficulty in walking, not elsewhere classified: Secondary | ICD-10-CM | POA: Insufficient documentation

## 2018-06-20 DIAGNOSIS — M5442 Lumbago with sciatica, left side: Secondary | ICD-10-CM | POA: Diagnosis not present

## 2018-06-20 DIAGNOSIS — G8929 Other chronic pain: Secondary | ICD-10-CM

## 2018-06-20 DIAGNOSIS — R293 Abnormal posture: Secondary | ICD-10-CM | POA: Diagnosis present

## 2018-06-20 DIAGNOSIS — M6281 Muscle weakness (generalized): Secondary | ICD-10-CM | POA: Insufficient documentation

## 2018-06-20 NOTE — Therapy (Signed)
[facility] [place] [address]  [place], [place], [address] Phone: [phone]   Fax:  [phone]  Physical Therapy Discharge  Patient Details  Name: Cybele Maule MRN: [id number] Date of Birth: Aug 18, 1986 Referring Provider: Carlynn Purl, DO    Progress Note Reporting Period 05/16/18 to 06/20/18  See note below for Objective Data and Assessment of Progress/Goals.    Encounter Date: 06/20/2018  PT End of Session - 06/20/18 1232    Visit Number  5    Number of Visits  7    Date for PT Re-Evaluation  06/27/18    Authorization Type  UHC    PT Start Time  1030    PT Stop Time  1105    PT Time Calculation (min)  35 min    Activity Tolerance  Patient tolerated treatment well    Behavior During Therapy  [facility] for tasks assessed/performed       Past Medical History:  Diagnosis Date  . Medical history non-contributory     No past surgical history on file.  There were no vitals filed for this visit.  Subjective Assessment - 06/20/18 1031    Subjective  Patient reports she is no different today. Has been having R sided pain when laying on back but able to move to relieve it. Would like to finish up with PT today as she is independent with her exercises. Reports 80% improvement since initial eval.    Patient Stated Goals  getting to pain free    Currently in Pain?  Yes    Pain Score  5     Pain Location  Back    Pain Orientation  Left;Lower    Pain Descriptors / Indicators  Sharp    Pain Type  Chronic pain         [facility] PT Assessment - 06/20/18 0001      Observation/Other Assessments   Focus on Therapeutic Outcomes (FOTO)   Lumbar: 57 (43% limited, 38% predicted)      Strength   Strength Assessment Site  Hip;Knee;Ankle    Right/Left Hip  Right;Left    Right Hip Flexion  4/5    Right Hip ABduction  4/5    Right Hip ADduction  4/5    Left Hip Flexion  4/5    Left Hip ABduction  4/5    Left Hip ADduction  4/5     Right/Left Knee  Right;Left    Right Knee Flexion  4/5    Right Knee Extension  4+/5    Left Knee Flexion  4/5    Left Knee Extension  4+/5    Right/Left Ankle  Right;Left    Right Ankle Dorsiflexion  4+/5    Right Ankle Plantar Flexion  4+/5    Left Ankle Dorsiflexion  4+/5    Left Ankle Plantar Flexion  4+/5                   [facility] Adult PT Treatment/Exercise - 06/20/18 0001      Lumbar Exercises: Seated   Other Seated Lumbar Exercises  sitting on pball marching 20x    Other Seated Lumbar Exercises  sitting on pball D2 flexion; scapular row; 15x each red TB             PT Education - 06/20/18 1230    Education provided  Yes    Education Details  HEP handout on core exercises on pball, disscussion and handout on  baby holding techniques, discussion and handout on baby lifting and car seat lifting techniques    Person(s) Educated  Patient    Methods  Explanation;Demonstration;Tactile cues;Verbal cues;Handout    Comprehension  Returned demonstration;Verbalized understanding       PT Short Term Goals - 06/20/18 1034      PT SHORT TERM GOAL #1   Title  Patient to be independent with initial HEP.    Time  3    Period  Weeks    Status  Achieved        PT Long Term Goals - 06/20/18 1034      PT LONG TERM GOAL #1   Title  Patient to be independent with advanced HEP.    Time  6    Period  Weeks    Status  Partially Met Reports intermittent compliance with HEP based on how she is feeling      PT LONG TERM GOAL #2   Title  Patient to demonstrate >=4+/5 strength in B LEs.    Time  6    Period  Weeks    Status  Not Met strength unchanged      PT LONG TERM GOAL #3   Title  Patient to perform picking up object off floor with <=1/10 pain.    Time  6    Period  Weeks    Status  Partially Met reports 2/10 pain in LB      PT LONG TERM GOAL #4   Title  Patient to report tolerance of 1.5 hours of sitting at work without pain limiting.     Time  6     Period  Weeks    Status  Partially Met reports 1 hour of sitting without pain            Plan - 06/20/18 1241    Clinical Impression Statement  Patient arrived to session late with report of 80% improvement in symptoms since initial eval. Reports compliance and independence with HPE, and reports she feels ready to d/c. Updated goals this date- patient with unchanged LE strength, but able to lift object off ground with significantly less reported pain, as well as improved sitting tolerance. Due to the progressive nature of a pregnancy, the patient's ability to maintain and manage symptoms shows significant improvement. Patient today able to perform core strengthening exercises on pball without trouble. Provided patient with education on updated HEP on pball as she has one at home. Also educated at length on infant holding and lifting techniques as well as car seat lifting techniques to prevent exacerbation of back pain. Patient reported understanding. Also educated on the benefits of pelvic PT post-partum. Patient to be d/c'd at this time d/t independence with HEP and satisfaction with current functional levels.     PT Treatment/Interventions  ADLs/Self Care Home Management;Cryotherapy;Moist Heat;Gait training;Stair training;Functional mobility training;Therapeutic activities;Therapeutic exercise;Manual techniques;Patient/family education;Neuromuscular re-education;Balance training;Passive range of motion;Energy conservation;Taping;Splinting    PT Next Visit Plan  d/c at this time    Consulted and Agree with Plan of Care  Patient       Patient will benefit from skilled therapeutic intervention in order to improve the following deficits and impairments:  Decreased activity tolerance, Decreased strength, Pain, Difficulty walking, Decreased range of motion, Postural dysfunction  Visit Diagnosis: Chronic midline low back pain with left-sided sciatica  Muscle weakness (generalized)  Abnormal  posture  Difficulty in walking, not elsewhere classified     Problem List Patient Active Problem List  Diagnosis Date Noted  . Postpartum care following vaginal delivery (5/2) 04/19/2014  . Gestational hypertension 04/18/2014    PHYSICAL THERAPY DISCHARGE SUMMARY  Visits from Start of Care: 5  Current functional level related to goals / functional outcomes: See above   Remaining deficits: B LE weakness, pain with lumbar flexion   Education / Equipment: HEP and lifting handouts, see above Plan: Patient agrees to discharge.  Patient goals were partially met. Patient is being discharged due to being pleased with the current functional level.  ?????      Janene Harvey, PT, DPT 06/20/18 12:50 PM  [facility] [address]  [place], [place], [address] Phone: [phone]   Fax:  [phone]  Name: Kathlen Sakurai MRN: [id number] Date of Birth: [birth year]

## 2018-06-27 ENCOUNTER — Ambulatory Visit: Payer: 59 | Admitting: Physical Therapy

## 2018-07-02 ENCOUNTER — Inpatient Hospital Stay (HOSPITAL_COMMUNITY)
Admission: AD | Admit: 2018-07-02 | Discharge: 2018-07-02 | Disposition: A | Discharge status: Home / Self Care | Payer: 59 | Source: Ambulatory Visit | Attending: Obstetrics and Gynecology | Admitting: Obstetrics and Gynecology

## 2018-07-02 ENCOUNTER — Encounter (HOSPITAL_COMMUNITY): Payer: Self-pay | Admitting: *Deleted

## 2018-07-02 ENCOUNTER — Other Ambulatory Visit: Payer: Self-pay

## 2018-07-02 DIAGNOSIS — O26893 Other specified pregnancy related conditions, third trimester: Secondary | ICD-10-CM | POA: Insufficient documentation

## 2018-07-02 DIAGNOSIS — R5383 Other fatigue: Secondary | ICD-10-CM

## 2018-07-02 DIAGNOSIS — R0602 Shortness of breath: Secondary | ICD-10-CM | POA: Diagnosis present

## 2018-07-02 DIAGNOSIS — Z881 Allergy status to other antibiotic agents status: Secondary | ICD-10-CM | POA: Insufficient documentation

## 2018-07-02 DIAGNOSIS — Z87891 Personal history of nicotine dependence: Secondary | ICD-10-CM | POA: Insufficient documentation

## 2018-07-02 DIAGNOSIS — O9989 Other specified diseases and conditions complicating pregnancy, childbirth and the puerperium: Secondary | ICD-10-CM | POA: Diagnosis not present

## 2018-07-02 DIAGNOSIS — Z3A35 35 weeks gestation of pregnancy: Secondary | ICD-10-CM | POA: Insufficient documentation

## 2018-07-02 DIAGNOSIS — Z3493 Encounter for supervision of normal pregnancy, unspecified, third trimester: Secondary | ICD-10-CM

## 2018-07-02 LAB — URINALYSIS, ROUTINE W REFLEX MICROSCOPIC
BILIRUBIN URINE: NEGATIVE
Glucose, UA: NEGATIVE mg/dL
HGB URINE DIPSTICK: NEGATIVE
Ketones, ur: NEGATIVE mg/dL
Nitrite: NEGATIVE
PROTEIN: NEGATIVE mg/dL
SPECIFIC GRAVITY, URINE: 1.008 (ref 1.005–1.030)
pH: 7 (ref 5.0–8.0)

## 2018-07-02 NOTE — Discharge Instructions (Signed)
Shortness of Breath, Adult Shortness of breath means you have trouble breathing. Your lungs are organs for breathing. Follow these instructions at home: Pay attention to any changes in your symptoms. Take these actions to help with your condition:  Do not smoke. Smoking can cause shortness of breath. If you need help to quit smoking, ask your doctor.  Avoid things that can make it harder to breathe, such as: ? Mold. ? Dust. ? Air pollution. ? Chemical smells. ? Things that can cause allergy symptoms (allergens), if you have allergies.  Keep your living space clean and free of mold and dust.  Rest as needed. Slowly return to your usual activities.  Take over-the-counter and prescription medicines, including oxygen and inhaled medicines, only as told by your doctor.  Keep all follow-up visits as told by your doctor. This is important.  Contact a doctor if:  Your condition does not get better as soon as expected.  You have a hard time doing your normal activities, even after you rest.  You have new symptoms. Get help right away if:  You have trouble breathing when you are resting.  You feel light-headed or you faint.  You have a cough that is not helped by medicines.  You cough up blood.  You have pain with breathing.  You have pain in your chest, arms, shoulders, or belly (abdomen).  You have a fever.  You cannot walk up stairs.  You cannot exercise the way you normally do. This information is not intended to replace advice given to you by your health care provider. Make sure you discuss any questions you have with your health care provider. Document Released: 05/23/2008 Document Revised: 12/22/2016 Document Reviewed: 12/22/2016 Elsevier Interactive Patient Education  2017 [employer] Third Trimester of Pregnancy The third trimester is from week 29 through week 42, months 7 through 9. This trimester is when your unborn baby (fetus) is growing very fast. At the end  of the ninth month, the unborn baby is about 20 inches in length. It weighs about 6-10 pounds. Follow these instructions at home:  Avoid all smoking, herbs, and alcohol. Avoid drugs not approved by your doctor.  Do not use any tobacco products, including cigarettes, chewing tobacco, and electronic cigarettes. If you need help quitting, ask your doctor. You may get counseling or other support to help you quit.  Only take medicine as told by your doctor. Some medicines are safe and some are not during pregnancy.  Exercise only as told by your doctor. Stop exercising if you start having cramps.  Eat regular, healthy meals.  Wear a good support bra if your breasts are tender.  Do not use hot tubs, steam rooms, or saunas.  Wear your seat belt when driving.  Avoid raw meat, uncooked cheese, and liter boxes and soil used by cats.  Take your prenatal vitamins.  Take 1500-2000 milligrams of calcium daily starting at the 20th week of pregnancy until you deliver your baby.  Try taking medicine that helps you poop (stool softener) as needed, and if your doctor approves. Eat more fiber by eating fresh fruit, vegetables, and whole grains. Drink enough fluids to keep your pee (urine) clear or pale yellow.  Take warm water baths (sitz baths) to soothe pain or discomfort caused by hemorrhoids. Use hemorrhoid cream if your doctor approves.  If you have puffy, bulging veins (varicose veins), wear support hose. Raise (elevate) your feet for 15 minutes, 3-4 times a day. Limit salt in your diet.  Avoid heavy lifting, wear low heels, and sit up straight.  Rest with your legs raised if you have leg cramps or low back pain.  Visit your dentist if you have not gone during your pregnancy. Use a soft toothbrush to brush your teeth. Be gentle when you floss.  You can have sex (intercourse) unless your doctor tells you not to.  Do not travel far distances unless you must. Only do so with your doctor's  approval.  Take prenatal classes.  Practice driving to the hospital.  Pack your hospital bag.  Prepare the baby's room.  Go to your doctor visits. Get help if:  You are not sure if you are in labor or if your water has broken.  You are dizzy.  You have mild cramps or pressure in your lower belly (abdominal).  You have a nagging pain in your belly area.  You continue to feel sick to your stomach (nauseous), throw up (vomit), or have watery poop (diarrhea).  You have bad smelling fluid coming from your vagina.  You have pain with peeing (urination). Get help right away if:  You have a fever.  You are leaking fluid from your vagina.  You are spotting or bleeding from your vagina.  You have severe belly cramping or pain.  You lose or gain weight rapidly.  You have trouble catching your breath and have chest pain.  You notice sudden or extreme puffiness (swelling) of your face, hands, ankles, feet, or legs.  You have not felt the baby move in over an hour.  You have severe headaches that do not go away with medicine.  You have vision changes. This information is not intended to replace advice given to you by your health care provider. Make sure you discuss any questions you have with your health care provider. Document Released: 03/01/2010 Document Revised: 05/12/2016 Document Reviewed: 02/05/2013 Elsevier Interactive Patient Education  2017 [employer].

## 2018-07-02 NOTE — MAU Provider Note (Signed)
History     CSN: [id number]  Arrival date and time: 07/02/18 1312   None     Chief Complaint  Patient presents with  . Shortness of Breath  . Fatigue   HPI Shelby Love is 32 y.o. G2P1001 6575w1d weeks presenting with shortness of breath that began today at 11 am.  She also became sweaty and dizziness. Denies syncope. Had a smoothie for breakfast and then ate again at noon.  She reports having had a lot of po fluids this am.  She is a patient of Dr. Shella SpearingBanga's. Last seen 1week ago.  Denies chest pain and vaginal bleeding.  + Braxton Hicks contractions.   + fetal movement.     Past Medical History:  Diagnosis Date  . Medical history non-contributory     History reviewed. No pertinent surgical history.  History reviewed. No pertinent family history.  Social History   Tobacco Use  . Smoking status: Former Smoker    Types: Cigarettes    Last attempt to quit: 08/12/2013    Years since quitting: 4.8  . Smokeless tobacco: Never Used  Substance Use Topics  . Alcohol use: No    Comment: occa  . Drug use: No    Allergies:  Allergies  Allergen Reactions  . Erythromycin Base Hives, Itching and Rash    Medications Prior to Admission  Medication Sig Dispense Refill Last Dose  . acetaminophen (TYLENOL) 500 MG tablet Take 1,000 mg by mouth every 6 (six) hours as needed for mild pain or headache.   Not Taking  . FOLIC ACID PO Take 1 tablet by mouth daily.   Not Taking  . ibuprofen (ADVIL,MOTRIN) 600 MG tablet Take 1 tablet (600 mg total) by mouth every 6 (six) hours as needed. (Patient not taking: Reported on 04/25/2018) 30 tablet 1 Not Taking  . iron polysaccharides (NIFEREX) 150 MG capsule Take 1 capsule (150 mg total) by mouth 2 (two) times daily. (Patient not taking: Reported on 04/25/2018) 60 capsule 1 Not Taking  . labetalol (NORMODYNE) 100 MG tablet Take 1 tablet (100 mg total) by mouth every 8 (eight) hours. (Patient not taking: Reported on 04/25/2018) 42 tablet 1 Not Taking  .  metroNIDAZOLE (METROGEL) 0.75 % gel Place 1 application vaginally at bedtime. Use for 5 nights for bacterial infection.  Started on 04-16-14   Not Taking  . oxyCODONE-acetaminophen (PERCOCET/ROXICET) 5-325 MG per tablet Take 1-2 tablets by mouth every 6 (six) hours as needed for severe pain (moderate - severe pain). (Patient not taking: Reported on 04/25/2018) 10 tablet 0 Not Taking  . Prenatal Vit-Fe Fumarate-FA (PRENATAL MULTIVITAMIN) TABS tablet Take 1 tablet by mouth daily at 12 noon.   Taking  . senna-docusate (SENOKOT-S) 8.6-50 MG per tablet Take 2 tablets by mouth at bedtime as needed for mild constipation. (Patient not taking: Reported on 04/25/2018) 60 tablet 1 Not Taking    Review of Systems  Constitutional: Positive for fatigue. Negative for activity change, appetite change and fever.  Respiratory: Positive for shortness of breath. Negative for cough, choking, chest tightness, wheezing and stridor.   Cardiovascular: Negative for chest pain.  Gastrointestinal: Negative for abdominal pain, nausea and vomiting.  Genitourinary: Negative for vaginal bleeding and vaginal pain.  Neurological: Positive for dizziness and weakness.   Physical Exam   Blood pressure 123/76, pulse (!) 122, temperature 98.3 F (36.8 C), temperature source Oral, resp. rate 18, height 5\' 7"  (1.702 m), weight 203 lb (92.1 kg), last menstrual period 10/29/2017, SpO2 100 %, unknown  if currently breastfeeding.  Physical Exam  Nursing note and vitals reviewed. Constitutional: She is oriented to person, place, and time. She appears well-developed and well-nourished. No distress.  HENT:  Head: Normocephalic.  Neck: Normal range of motion.  Cardiovascular: Regular rhythm.  tachycardic  Respiratory: Effort normal and breath sounds normal. No respiratory distress. She has no wheezes. She has no rales.  Neurological: She is alert and oriented to person, place, and time.  Skin: Skin is warm and dry.  Psychiatric: She has a  normal mood and affect. Her behavior is normal. Thought content normal.   Results for orders placed or performed during the hospital encounter of 07/02/18 (from the past 24 hour(s))  Urinalysis, Routine w reflex microscopic     Status: Abnormal   Collection Time: 07/02/18  1:37 PM  Result Value Ref Range   Color, Urine YELLOW YELLOW   APPearance HAZY (A) CLEAR   Specific Gravity, Urine 1.008 1.005 - 1.030   pH 7.0 5.0 - 8.0   Glucose, UA NEGATIVE NEGATIVE mg/dL   Hgb urine dipstick NEGATIVE NEGATIVE   Bilirubin Urine NEGATIVE NEGATIVE   Ketones, ur NEGATIVE NEGATIVE mg/dL   Protein, ur NEGATIVE NEGATIVE mg/dL   Nitrite NEGATIVE NEGATIVE   Leukocytes, UA TRACE (A) NEGATIVE   RBC / HPF 0-5 0 - 5 RBC/hpf   WBC, UA 11-20 0 - 5 WBC/hpf   Bacteria, UA RARE (A) NONE SEEN   Squamous Epithelial / LPF 21-50 0 - 5    Vitals:   07/02/18 1318 07/02/18 1323  BP:  123/76  Pulse:  (!) 122  Resp:  18  Temp:  98.3 F (36.8 C)  TempSrc:  Oral  SpO2:  100%  Weight: 203 lb (92.1 kg)   Height: 5\' 7"  (1.702 m)     NST: reactive          FHR baseline 135-140           Moderate variability           15 X 15s  MAU Course  Procedures  MDM MSE Exam PO hydration NST reactive. Reported HPI, Vitals, exam findings to Dr. Senaida Ores.  Order given to discuss adding protein to breakfast, stay well hydrated.  Has appt next Monday for continued prenatal care.   Assessment and Plan  A:  [redacted]w[redacted]d gestation     Shortness of breath  P:  Discharge to home       Instructed to stay well hydrated and add protein to meals      Call for worsening sxs       Keep scheduled appt.     Dennison Mascot Delanda Bulluck 07/02/2018, 2:47 PM

## 2018-07-02 NOTE — MAU Provider Note (Signed)
Pt came in for SOB and lightheadedness and has normal w/u.  FHR category 1 and reactive.

## 2018-07-02 NOTE — MAU Note (Signed)
Pt presents with c/o SOB that was occurring earlier, not currently.  Also c/o fatigue and weakness. Denies VB or LOF.  Reports +FM.

## 2018-07-26 ENCOUNTER — Encounter (HOSPITAL_COMMUNITY): Payer: Self-pay

## 2018-07-26 ENCOUNTER — Inpatient Hospital Stay (HOSPITAL_COMMUNITY)
Admission: AD | Admit: 2018-07-26 | Discharge: 2018-07-29 | DRG: 807 | Disposition: A | Discharge status: Home / Self Care | Payer: 59 | Attending: Obstetrics and Gynecology | Admitting: Obstetrics and Gynecology

## 2018-07-26 ENCOUNTER — Inpatient Hospital Stay (HOSPITAL_COMMUNITY)
Admission: AD | Admit: 2018-07-26 | Discharge: 2018-07-26 | Disposition: A | Discharge status: Home / Self Care | Payer: 59 | Source: Ambulatory Visit | Attending: Obstetrics and Gynecology | Admitting: Obstetrics and Gynecology

## 2018-07-26 DIAGNOSIS — O358XX0 Maternal care for other (suspected) fetal abnormality and damage, not applicable or unspecified: Secondary | ICD-10-CM | POA: Diagnosis present

## 2018-07-26 DIAGNOSIS — Z3A38 38 weeks gestation of pregnancy: Secondary | ICD-10-CM

## 2018-07-26 DIAGNOSIS — O471 False labor at or after 37 completed weeks of gestation: Secondary | ICD-10-CM

## 2018-07-26 DIAGNOSIS — Z87891 Personal history of nicotine dependence: Secondary | ICD-10-CM | POA: Diagnosis not present

## 2018-07-26 DIAGNOSIS — Z3483 Encounter for supervision of other normal pregnancy, third trimester: Secondary | ICD-10-CM | POA: Diagnosis present

## 2018-07-26 LAB — TYPE AND SCREEN
ABO/RH(D): O POS
Antibody Screen: NEGATIVE

## 2018-07-26 LAB — CBC
HCT: 29 % — ABNORMAL LOW (ref 36.0–46.0)
Hemoglobin: 9.6 g/dL — ABNORMAL LOW (ref 12.0–15.0)
MCH: 26.3 pg (ref 26.0–34.0)
MCHC: 33.1 g/dL (ref 30.0–36.0)
MCV: 79.5 fL (ref 78.0–100.0)
PLATELETS: 294 10*3/uL (ref 150–400)
RBC: 3.65 MIL/uL — AB (ref 3.87–5.11)
RDW: 16.6 % — ABNORMAL HIGH (ref 11.5–15.5)
WBC: 17.5 10*3/uL — AB (ref 4.0–10.5)

## 2018-07-26 MED ORDER — OXYCODONE-ACETAMINOPHEN 5-325 MG PO TABS: 2.0000 | ORAL_TABLET | ORAL | Status: DC | PRN

## 2018-07-26 MED ORDER — OXYCODONE-ACETAMINOPHEN 5-325 MG PO TABS: 1.0000 | ORAL_TABLET | ORAL | Status: DC | PRN

## 2018-07-26 MED ORDER — ONDANSETRON HCL 4 MG/2ML IJ SOLN: 4.0000 mg | Freq: Four times a day (QID) | INTRAMUSCULAR | Status: DC | PRN

## 2018-07-26 MED ORDER — BUTORPHANOL TARTRATE 1 MG/ML IJ SOLN
1.0000 mg | INTRAMUSCULAR | Status: DC | PRN
  Administered 2018-07-27 (×2): 1 mg via INTRAVENOUS
  Filled 2018-07-26 (×3): qty 1

## 2018-07-26 MED ORDER — LACTATED RINGERS IV SOLN
500.0000 mL | INTRAVENOUS | Status: DC | PRN
  Administered 2018-07-27: 500 mL via INTRAVENOUS

## 2018-07-26 MED ORDER — OXYTOCIN 40 UNITS IN LACTATED RINGERS INFUSION - SIMPLE MED
2.5000 [IU]/h | INTRAVENOUS | Status: DC
  Filled 2018-07-26: qty 1000

## 2018-07-26 MED ORDER — LACTATED RINGERS IV SOLN
INTRAVENOUS | Status: DC
  Administered 2018-07-26: 19:00:00 via INTRAVENOUS

## 2018-07-26 MED ORDER — LIDOCAINE HCL (PF) 1 % IJ SOLN
30.0000 mL | INTRAMUSCULAR | Status: AC | PRN
  Administered 2018-07-27: 30 mL via SUBCUTANEOUS
  Filled 2018-07-26: qty 30

## 2018-07-26 MED ORDER — SOD CITRATE-CITRIC ACID 500-334 MG/5ML PO SOLN
30.0000 mL | ORAL | Status: DC | PRN
  Administered 2018-07-26: 30 mL via ORAL
  Filled 2018-07-26 (×2): qty 15

## 2018-07-26 MED ORDER — ACETAMINOPHEN 325 MG PO TABS: 650.0000 mg | ORAL_TABLET | ORAL | Status: DC | PRN

## 2018-07-26 MED ORDER — OXYTOCIN BOLUS FROM INFUSION
500.0000 mL | Freq: Once | INTRAVENOUS | Status: AC
  Administered 2018-07-27: 500 mL via INTRAVENOUS

## 2018-07-26 NOTE — MAU Note (Signed)
CTX 3.5 minutes apart.  No LOF/VB.  +FM.  Last exam 3 1/2 cm.

## 2018-07-26 NOTE — Progress Notes (Signed)
Counted by Wilkie AyeKristy L. And Rock NephewJesse K

## 2018-07-26 NOTE — MAU Note (Signed)
Pt seen in MAU this morning, DC'd home.  Contractions are now 5 minutes apart, increased pelvic pressure.  Denies bleeding or LOF.  Reports good fetal movement.

## 2018-07-26 NOTE — MAU Note (Signed)
I have communicated with Dr. Senaida Oresichardson & Dr. Jackelyn KnifeMeisinger and reviewed vital signs:  Vitals:   07/26/18 0338  BP: 133/79  Pulse: 87  Resp: 19  Temp: 98.4 F (36.9 C)    Vaginal exam:  Dilation: 4 Effacement (%): 90 Cervical Position: Anterior Station: -2 Presentation: Vertex Exam by:: Latricia HeftAnna Mariadelosang Wynns, RN,   Also reviewed contraction pattern and that non-stress test is reactive.  It has been documented that patient is contracting every 6-7 minutes with no cervical change over 4 hours not indicating active labor.  Patient denies any other complaints.  Based on this report provider has given order for discharge.  A discharge order and diagnosis entered by a provider.   Labor discharge instructions reviewed with patient.

## 2018-07-26 NOTE — Anesthesia Pain Management Evaluation Note (Signed)
  CRNA Pain Management Visit Note  Patient: Shelby Love, 32 y.o., female  "Hello I am a [facility] of the anesthesia [facility]. We have an anesthesia team available at all times to provide care throughout the hospital, including epidural management and anesthesia for C-section. I don't know your plan for the delivery whether it a natural birth, water birth, IV sedation, nitrous supplementation, doula or epidural, but we want to meet your pain goals."   1.Was your pain managed to your expectations on prior hospitalizations?   Yes   2.What is your expectation for pain management during this hospitalization?     Labor support without medications  3.How can we help you reach that goal? natural  Record the patient's initial score and the patient's pain goal.   Pain: 8  Pain Goal: 9 [facility] wants you to be able to say your pain was always managed very well.  Laterica Matarazzo 07/26/2018

## 2018-07-27 ENCOUNTER — Inpatient Hospital Stay (HOSPITAL_COMMUNITY): Payer: 59 | Admitting: Anesthesiology

## 2018-07-27 ENCOUNTER — Encounter (HOSPITAL_COMMUNITY): Payer: Self-pay | Admitting: Obstetrics and Gynecology

## 2018-07-27 LAB — CBC
HEMATOCRIT: 28.5 % — AB (ref 36.0–46.0)
HEMOGLOBIN: 9.2 g/dL — AB (ref 12.0–15.0)
MCH: 25.5 pg — ABNORMAL LOW (ref 26.0–34.0)
MCHC: 32.3 g/dL (ref 30.0–36.0)
MCV: 78.9 fL (ref 78.0–100.0)
Platelets: 266 10*3/uL (ref 150–400)
RBC: 3.61 MIL/uL — ABNORMAL LOW (ref 3.87–5.11)
RDW: 16.7 % — AB (ref 11.5–15.5)
WBC: 18.4 10*3/uL — AB (ref 4.0–10.5)

## 2018-07-27 LAB — RPR: RPR Ser Ql: NONREACTIVE

## 2018-07-27 MED ORDER — SENNOSIDES-DOCUSATE SODIUM 8.6-50 MG PO TABS
2.0000 | ORAL_TABLET | ORAL | Status: DC
  Administered 2018-07-28 (×2): 2 via ORAL
  Filled 2018-07-27 (×2): qty 2

## 2018-07-27 MED ORDER — LACTATED RINGERS IV SOLN: 500.0000 mL | Freq: Once | INTRAVENOUS | Status: DC

## 2018-07-27 MED ORDER — LACTATED RINGERS AMNIOINFUSION
INTRAVENOUS | Status: DC
  Administered 2018-07-27: 150 mL/h via INTRAUTERINE
  Filled 2018-07-27: qty 1000

## 2018-07-27 MED ORDER — EPHEDRINE 5 MG/ML INJ
10.0000 mg | INTRAVENOUS | Status: DC | PRN
  Filled 2018-07-27: qty 2

## 2018-07-27 MED ORDER — PHENYLEPHRINE 40 MCG/ML (10ML) SYRINGE FOR IV PUSH (FOR BLOOD PRESSURE SUPPORT): 80.0000 ug | PREFILLED_SYRINGE | INTRAVENOUS | Status: DC | PRN

## 2018-07-27 MED ORDER — BENZOCAINE-MENTHOL 20-0.5 % EX AERO
1.0000 "application " | INHALATION_SPRAY | CUTANEOUS | Status: DC | PRN
  Administered 2018-07-27: 1 via TOPICAL
  Filled 2018-07-27: qty 56

## 2018-07-27 MED ORDER — ONDANSETRON HCL 4 MG PO TABS: 4.0000 mg | ORAL_TABLET | ORAL | Status: DC | PRN

## 2018-07-27 MED ORDER — ONDANSETRON HCL 4 MG/2ML IJ SOLN: 4.0000 mg | INTRAMUSCULAR | Status: DC | PRN

## 2018-07-27 MED ORDER — PHENYLEPHRINE 40 MCG/ML (10ML) SYRINGE FOR IV PUSH (FOR BLOOD PRESSURE SUPPORT)
80.0000 ug | PREFILLED_SYRINGE | INTRAVENOUS | Status: DC | PRN
  Filled 2018-07-27: qty 5

## 2018-07-27 MED ORDER — FENTANYL 2.5 MCG/ML BUPIVACAINE 1/10 % EPIDURAL INFUSION (WH - ANES)
14.0000 mL/h | INTRAMUSCULAR | Status: DC | PRN
  Administered 2018-07-27: 14 mL/h via EPIDURAL

## 2018-07-27 MED ORDER — OXYTOCIN 40 UNITS IN LACTATED RINGERS INFUSION - SIMPLE MED
1.0000 m[IU]/min | INTRAVENOUS | Status: DC
  Administered 2018-07-27: 2 m[IU]/min via INTRAVENOUS

## 2018-07-27 MED ORDER — PHENYLEPHRINE 40 MCG/ML (10ML) SYRINGE FOR IV PUSH (FOR BLOOD PRESSURE SUPPORT)
PREFILLED_SYRINGE | INTRAVENOUS | Status: AC
  Filled 2018-07-27: qty 10

## 2018-07-27 MED ORDER — DIBUCAINE 1 % RE OINT: 1.0000 "application " | TOPICAL_OINTMENT | RECTAL | Status: DC | PRN

## 2018-07-27 MED ORDER — LACTATED RINGERS IV SOLN: INTRAVENOUS | Status: DC

## 2018-07-27 MED ORDER — ACETAMINOPHEN 325 MG PO TABS: 650.0000 mg | ORAL_TABLET | ORAL | Status: DC | PRN

## 2018-07-27 MED ORDER — DIPHENHYDRAMINE HCL 25 MG PO CAPS: 25.0000 mg | ORAL_CAPSULE | Freq: Four times a day (QID) | ORAL | Status: DC | PRN

## 2018-07-27 MED ORDER — IBUPROFEN 600 MG PO TABS
600.0000 mg | ORAL_TABLET | Freq: Four times a day (QID) | ORAL | Status: DC
  Administered 2018-07-27 – 2018-07-29 (×8): 600 mg via ORAL
  Filled 2018-07-27 (×8): qty 1

## 2018-07-27 MED ORDER — FENTANYL 2.5 MCG/ML BUPIVACAINE 1/10 % EPIDURAL INFUSION (WH - ANES)
INTRAMUSCULAR | Status: AC
  Filled 2018-07-27: qty 100

## 2018-07-27 MED ORDER — WITCH HAZEL-GLYCERIN EX PADS: 1.0000 "application " | MEDICATED_PAD | CUTANEOUS | Status: DC | PRN

## 2018-07-27 MED ORDER — ZOLPIDEM TARTRATE 5 MG PO TABS: 5.0000 mg | ORAL_TABLET | Freq: Every evening | ORAL | Status: DC | PRN

## 2018-07-27 MED ORDER — LIDOCAINE HCL (PF) 1 % IJ SOLN
INTRAMUSCULAR | Status: DC | PRN
  Administered 2018-07-27: 5 mL via EPIDURAL

## 2018-07-27 MED ORDER — SIMETHICONE 80 MG PO CHEW
80.0000 mg | CHEWABLE_TABLET | ORAL | Status: DC | PRN
Start: 2018-07-27 — End: 2018-07-29

## 2018-07-27 MED ORDER — PRENATAL MULTIVITAMIN CH
1.0000 | ORAL_TABLET | Freq: Every day | ORAL | Status: DC
  Administered 2018-07-28: 1 via ORAL
  Filled 2018-07-27: qty 1

## 2018-07-27 MED ORDER — TERBUTALINE SULFATE 1 MG/ML IJ SOLN
0.2500 mg | Freq: Once | INTRAMUSCULAR | Status: DC | PRN
  Filled 2018-07-27: qty 1

## 2018-07-27 MED ORDER — DIPHENHYDRAMINE HCL 50 MG/ML IJ SOLN: 12.5000 mg | INTRAMUSCULAR | Status: DC | PRN

## 2018-07-27 MED ORDER — TETANUS-DIPHTH-ACELL PERTUSSIS 5-2.5-18.5 LF-MCG/0.5 IM SUSP: 0.5000 mL | Freq: Once | INTRAMUSCULAR | Status: DC

## 2018-07-27 MED ORDER — EPHEDRINE 5 MG/ML INJ: 10.0000 mg | INTRAVENOUS | Status: DC | PRN

## 2018-07-27 MED ORDER — COCONUT OIL OIL: 1.0000 "application " | TOPICAL_OIL | Status: DC | PRN

## 2018-07-27 NOTE — Anesthesia Postprocedure Evaluation (Signed)
Anesthesia Post Note  Patient: Shelby Love  Procedure(s) Performed: AN AD HOC LABOR EPIDURAL     Patient location during evaluation: Mother Baby Anesthesia Type: Epidural Level of consciousness: awake Pain management: satisfactory to patient Vital Signs Assessment: post-procedure vital signs reviewed and stable Respiratory status: spontaneous breathing Cardiovascular status: stable Anesthetic complications: no    Last Vitals:  Vitals:   07/27/18 1400 07/27/18 1741  BP: 138/81 (!) 142/82  Pulse: 75 75  Resp:    Temp: 37.4 C 37.2 C  SpO2:      Last Pain:  Vitals:   07/27/18 1741  TempSrc: Oral  PainSc:    Pain Goal: Patients Stated Pain Goal: 0 (07/26/18 1627)               Cephus ShellingBURGER,Aydin Hink

## 2018-07-27 NOTE — Anesthesia Preprocedure Evaluation (Signed)
Anesthesia Evaluation  Patient identified by MRN, date of birth, ID band Patient awake    Reviewed: Allergy & Precautions, Patient's Chart, lab work & pertinent test results  Airway Mallampati: II       Dental  (+) Teeth Intact   Pulmonary former smoker,    Pulmonary exam normal        Cardiovascular hypertension, Normal cardiovascular exam     Neuro/Psych    GI/Hepatic negative GI ROS, Neg liver ROS,   Endo/Other    Renal/GU negative Renal ROS     Musculoskeletal   Abdominal Normal abdominal exam  (+)   Peds  Hematology negative hematology ROS (+)   Anesthesia Other Findings   Reproductive/Obstetrics (+) Pregnancy                             Anesthesia Physical Anesthesia Plan  ASA: II  Anesthesia Plan: Epidural   Post-op Pain Management:    Induction:   PONV Risk Score and Plan:   Airway Management Planned: Natural Airway  Additional Equipment:   Intra-op Plan:   Post-operative Plan:   Informed Consent: I have reviewed the patients History and Physical, chart, labs and discussed the procedure including the risks, benefits and alternatives for the proposed anesthesia with the patient or authorized representative who has indicated his/her understanding and acceptance.     Plan Discussed with:   Anesthesia Plan Comments: (Lab Results      Component                Value               Date                      WBC                      18.4 (H)            07/27/2018                HGB                      9.2 (L)             07/27/2018                HCT                      28.5 (L)            07/27/2018                MCV                      78.9                07/27/2018                PLT                      266                 07/27/2018           )        Anesthesia Quick Evaluation

## 2018-07-27 NOTE — Anesthesia Procedure Notes (Signed)
Epidural Patient location during procedure: OB Start time: 07/27/2018 9:54 AM End time: 07/27/2018 10:00 AM  Staffing Anesthesiologist: Shelton SilvasHollis, Cedarius Kersh D, MD Performed: anesthesiologist   Preanesthetic Checklist Completed: patient identified, site marked, surgical consent, pre-op evaluation, timeout performed, IV checked, risks and benefits discussed and monitors and equipment checked  Epidural Patient position: sitting Prep: ChloraPrep Patient monitoring: heart rate, continuous pulse ox and blood pressure Approach: midline Location: L3-L4 Injection technique: LOR saline  Needle:  Needle type: Tuohy  Needle gauge: 17 G Needle length: 9 cm Catheter type: closed end flexible Catheter size: 20 Guage Test dose: negative and 1.5% lidocaine  Assessment Events: blood not aspirated, injection not painful, no injection resistance and no paresthesia  Additional Notes LOR @ 5.5  Patient identified. Risks/Benefits/Options discussed with patient including but not limited to bleeding, infection, nerve damage, paralysis, failed block, incomplete pain control, headache, blood pressure changes, nausea, vomiting, reactions to medications, itching and postpartum back pain. Confirmed with bedside nurse the patient's most recent platelet count. Confirmed with patient that they are not currently taking any anticoagulation, have any bleeding history or any family history of bleeding disorders. Patient expressed understanding and wished to proceed. All questions were answered. Sterile technique was used throughout the entire procedure. Please see nursing notes for vital signs. Test dose was given through epidural catheter and negative prior to continuing to dose epidural or start infusion. Warning signs of high block given to the patient including shortness of breath, tingling/numbness in hands, complete motor block, or any concerning symptoms with instructions to call for help. Patient was given instructions on  fall risk and not to get out of bed. All questions and concerns addressed with instructions to call with any issues or inadequate analgesia.    Reason for block:procedure for pain

## 2018-07-27 NOTE — Progress Notes (Signed)
Feeling ctx Afeb, VSS FHT- 120-140, mod variability, + accels, some variable decels, Cat II, ctx not tracing but seem to be every 4-10 minutes VE-6-7/80/-1, vtx Discussed protracted labor, recommended pitocin, she is considering, has not requested anything for pain

## 2018-07-27 NOTE — Progress Notes (Signed)
Patient ID: Shelby Love Shelby Love, female   DOB: Jan 22, 1986, 32 y.o.   MRN: [id number]   Came in over night in early labor.  SROM.  Uncomfortable w ctx  AFVSS gen NAD FHTs 130's mod var, + accels, some variable decel, category 1-2 toco q 3min  Rupture of forebag mod/thick meconium SVE 7.8/90/0  Pitocin recently started Offered epidural or nitrous

## 2018-07-27 NOTE — Lactation Note (Signed)
This note was copied from a baby's chart. Lactation Consultation Note  Patient Name: Shelby Love Reason for consult: Initial assessment;Early term 4837-38.6wks  Visited with P2 Mom of 1911w5d baby at 14 hrs old age.  [facility]  Mom describes a wide, deep latch without any discomfort. Reviewed breast massage and hand expression, colostrum glistening noted.  Encouraged Mom to do this prior to baby latching next time.   Baby sleeping STS on Mom's chest.  Talked about how to recognize feeding cues.  Encouraged Mom to call for assistance prn.  Goal is >8 feedings per 24 hrs, but normal newborn sleepiness in first day of life, so important to keep him STS to encourage more feedings. Lactation brochure given to Mom.  Informed of IP and OP lactation services provided.  To call prn for assistance.   Consult Status Consult Status: Follow-up Date: 07/28/18 Follow-up type: In-patient    Judee ClaraSmith, Kadeen Sroka E Love, 3:04 PM

## 2018-07-27 NOTE — H&P (Signed)
Shelby HamiltonMiesha Love is a 32 y.o. female, G2 P1001, EGA 38+ weeks with EDC 8-18 presenting for ctx.  Was in MAU 8-8 am with ctx, VE 4 cm but did not change over several hours.  Sent home, re-presents about 12 hours later with increasing ctx, VE 5-6 cm.  Prenatal care complicated by fetal renal pyelectasis that resolved.  OB History    Gravida  2   Para  1   Term  1   Preterm      AB      Living  1     SAB      TAB      Ectopic      Multiple      Live Births  1          Past Medical History:  Diagnosis Date  . Medical history non-contributory    No past surgical history on file. Family History: family history is not on file. Social History:  reports that she quit smoking about 4 years ago. Her smoking use included cigarettes. She has never used smokeless tobacco. She reports that she does not drink alcohol or use drugs.     Maternal Diabetes: No Genetic Screening: Normal Maternal Ultrasounds/Referrals: Abnormal:  Findings:   Fetal renal pyelectasis Fetal Ultrasounds or other Referrals:  None Maternal Substance Abuse:  No Significant Maternal Medications:  None Significant Maternal Lab Results:  Lab values include: Group B Strep negative Other Comments:  None  Review of Systems  Respiratory: Negative.   Cardiovascular: Negative.    Maternal Medical History:  Reason for admission: Contractions.   Contractions: Frequency: regular.   Perceived severity is moderate.    Fetal activity: Perceived fetal activity is normal.    Prenatal complications: no prenatal complications Prenatal Complications - Diabetes: none.    Dilation: 8 Effacement (%): 100 Station: 0 Exam by:: S Willis RN Blood pressure 126/68, pulse 90, temperature 99.3 F (37.4 C), temperature source Oral, resp. rate 18, last menstrual period 10/29/2017, unknown if currently breastfeeding. Maternal Exam:  Uterine Assessment: Contraction strength is moderate.  Contraction frequency is regular.    Abdomen: Patient reports no abdominal tenderness. Estimated fetal weight is 8 lbs.   Fetal presentation: vertex  Introitus: Normal vagina.  Amniotic fluid character: meconium stained.  Pelvis: adequate for delivery.   Cervix: Cervix evaluated by digital exam.     Fetal Exam Fetal Monitor Review: Mode: ultrasound.   Baseline rate: 120s.  Variability: moderate (6-25 bpm).   Pattern: accelerations present and no decelerations.    Fetal State Assessment: Category I - tracings are normal.     Physical Exam  Vitals reviewed. Constitutional: She appears well-developed and well-nourished.  Cardiovascular: Normal rate and regular rhythm.  Respiratory: Effort normal. No respiratory distress.  GI: Soft.    Prenatal labs: ABO, Rh: --/--/O POS (08/08 1927) Antibody: NEG (08/08 1927) Rubella:  Immune RPR:   NR HBsAg:   Neg HIV:   NR GBS:   Neg  Assessment/Plan: IUP at 38+ weeks now in active labor, had SROM with light meconium.  Monitor progress, anticipate SVD   Shelby Love 07/27/2018, 12:35 AM

## 2018-07-28 LAB — CBC
HEMATOCRIT: 25.5 % — AB (ref 36.0–46.0)
HEMOGLOBIN: 8.3 g/dL — AB (ref 12.0–15.0)
MCH: 26 pg (ref 26.0–34.0)
MCHC: 32.5 g/dL (ref 30.0–36.0)
MCV: 79.9 fL (ref 78.0–100.0)
Platelets: 234 10*3/uL (ref 150–400)
RBC: 3.19 MIL/uL — ABNORMAL LOW (ref 3.87–5.11)
RDW: 16.8 % — ABNORMAL HIGH (ref 11.5–15.5)
WBC: 18.8 10*3/uL — ABNORMAL HIGH (ref 4.0–10.5)

## 2018-07-28 NOTE — Progress Notes (Signed)
CSW received consult for MOB due to history of anxiety, bipolar, and depression. CSW attempted to meet with MOB twice prior to extensive chart review but was unsuccessful both times due to nursing staff taking care of MOB & newborn and photography staff being present. CSW reviewed MOB's chart and prenatal record. There are no documented occurrences of symptoms present or that these diagnoses are current or accurate. [facility] and no mention of symptoms or concerns present there. If concerns arise, MOB requests, or if MOB scores a ten or higher or answers yes to question 10 on EPDS, please submit additional consult for CSW. CSW will not attempt to visit with MOB again unless needed.  Edwin Dadaarol Takirah Binford, MSW, LCSW-A Clinical Social Worker [facility] [phone]

## 2018-07-28 NOTE — Progress Notes (Signed)
Post Partum Day 1 Subjective: no complaints, up ad lib, voiding, tolerating PO and nl lochia, pain controlled  Objective: Blood pressure (!) 139/95, pulse 71, temperature (!) 97.3 F (36.3 C), temperature source Oral, resp. rate 18, last menstrual period 10/29/2017, SpO2 99 %, unknown if currently breastfeeding.  Physical Exam:  General: alert and no distress Lochia: appropriate Uterine Fundus: firm   Recent Labs    07/26/18 1927 07/27/18 0926  HGB 9.6* 9.2*  HCT 29.0* 28.5*    Assessment/Plan: Plan for discharge tomorrow, Breastfeeding and Lactation consult.  Routine PP care.     LOS: 2 days   Kyarra Vancamp Bovard-Stuckert 07/28/2018, 7:08 AM

## 2018-07-29 ENCOUNTER — Ambulatory Visit: Payer: Self-pay

## 2018-07-29 MED ORDER — PRENATAL MULTIVITAMIN CH
1.0000 | ORAL_TABLET | Freq: Every day | ORAL | 3 refills | Status: DC
Start: 2018-07-29 — End: 2022-03-01

## 2018-07-29 MED ORDER — IBUPROFEN 600 MG PO TABS: 600.0000 mg | ORAL_TABLET | Freq: Four times a day (QID) | ORAL | 0 refills | Status: DC | PRN

## 2018-07-29 NOTE — Discharge Summary (Signed)
Obstetric Discharge Summary Reason for Admission: onset of labor Prenatal Procedures: none Intrapartum Procedures: spontaneous vaginal delivery Postpartum Procedures: none Complications-Operative and Postpartum: none Hemoglobin  Date Value Ref Range Status  07/28/2018 8.3 (L) 12.0 - 15.0 g/dL Final   HCT  Date Value Ref Range Status  07/28/2018 25.5 (L) 36.0 - 46.0 % Final    Physical Exam:  General: alert and no distress Lochia: appropriate Uterine Fundus: firm  Discharge Diagnoses: Term Pregnancy-delivered  Discharge Information: Date: 07/29/2018 Activity: pelvic rest Diet: routine Medications: PNV and Ibuprofen Condition: stable Instructions: refer to practice specific booklet Discharge to: home Follow-up Information    Love, Shelby GambleJody, MD. Schedule an appointment as soon as possible for a visit.   Specialty:  Obstetrics and Gynecology Why:  for postpartum check Contact information: [address] [place] [address] [phone]           Newborn Data: Live born female  Birth Weight: 7 lb 7.9 oz (3400 g) APGAR: 9, 9  Newborn Delivery   Birth date/time:  07/27/2018 10:30:00 Delivery type:  Vaginal, Spontaneous     Home with mother.  Shelby Love 07/29/2018, 9:54 AM

## 2018-07-29 NOTE — Lactation Note (Signed)
This note was copied from a baby's chart. Lactation Consultation Note Baby 5943 hrs old. Just had a stool except for stool at birth.  Mom is supplementing 10ml. Mom sleeping, FOB holding baby on his chest sleeping. LC woke FOB asked if LC could place baby in crib, FOB agreed. Gave FOB supplementing sheet w/amount to be given after supplementing and to increase amount according to hours of age. FOB stated teach back.  Reported to RN.  Patient Name: Shelby Love ZOXWR'UToday's Date: 07/29/2018 Reason for consult: Follow-up assessment   Maternal Data    Feeding Feeding Type: Breast Fed Length of feed: 40 min  LATCH Score                   Interventions    Lactation Tools Discussed/Used     Consult Status Consult Status: Follow-up Date: 07/29/18 Follow-up type: In-patient    Charyl DancerCARVER, Shelby Love 07/29/2018, 5:56 AM

## 2018-07-29 NOTE — Progress Notes (Signed)
Post Partum Day 2 Subjective: no complaints, up ad lib, voiding, tolerating PO and nl lochia, pain controlled  Objective: Blood pressure 132/75, pulse 74, temperature 97.9 F (36.6 C), temperature source Oral, resp. rate 18, last menstrual period 10/29/2017, SpO2 99 %, unknown if currently breastfeeding.  Physical Exam:  General: alert and no distress Lochia: appropriate Uterine Fundus: firm   Recent Labs    07/27/18 0926 07/28/18 0624  HGB 9.2* 8.3*  HCT 28.5* 25.5*    Assessment/Plan: Discharge home, Breastfeeding and Lactation consult.  Routine PP care.  D/C with motrin and PNV.  F/u 6 weeks   LOS: 3 days   Shelby Love 07/29/2018, 8:58 AM

## 2018-07-29 NOTE — Lactation Note (Signed)
This note was copied from a baby's chart. Lactation Consultation Note  Patient Name: Shelby Love ZOXWR'UToday's Date: 07/29/2018 Reason for consult: Follow-up assessment;Early term 8137-38.6wks  Baby is 7249 hours old , 3 % weight loss, had to start supplementing due to  No void > 24 hours.  LC recommended to increase voids - prior to latch - breast massage, hand express,  Pre-pump to prime the milk ducts and enhance let down, also prior to latch can apply  Warm moist wash clothes to hep the let down.  Mom denies sore nipples, and breast are feeling fuller today , and hearing more swallows With latch. Mom plans to use her app on her phone for I/O's.  Sore nipple and engorgement prevention and tx reviewed.  LC instructed mom on the use hand pump, provided #27 F if needed for when milk comes  In.  LC discussed nutritive vs non - nutritive feeding patterns and the importance of watching  Baby for hanging out latched. Importance of STS feedings until the baby can stay awake for  Feeding , and gaining steadily.  Per mom has a DEBP at home.  Mother informed of post-discharge support and given phone number to the lactation department, including services for phone call assistance; out-patient appointments; and breastfeeding support group. List of other breastfeeding resources in the community given in the handout. Encouraged mother to call for problems or concerns related to breastfeeding.   Maternal Data Has patient been taught Hand Expression?: Yes(per mom feels comfortable )  Feeding Feeding Type: (per mom baby last fed at 0945 - )  LATCH Score                   Interventions Interventions: Breast feeding basics reviewed;Hand pump  Lactation Tools Discussed/Used Tools: Pump;Flanges Flange Size: 24;27;Other (comment)(#27 for when milk comes in ) Breast pump type: Manual Pump Review: Setup, frequency, and cleaning;Milk Storage Initiated by:: MAI  Date initiated::  07/29/18   Consult Status Consult Status: Complete Date: 07/29/18    Matilde SprangMargaret Ann Asael Pann 07/29/2018, 12:05 PM

## 2019-10-16 IMAGING — US US MFM OB DETAIL+14 WK
1 series · 13 of 28 positions shown · non-contrast
Comparison: none

[Series 1: us mfm ob detail+14 wk · 13 of 88 slices shown]
[im 4/88]
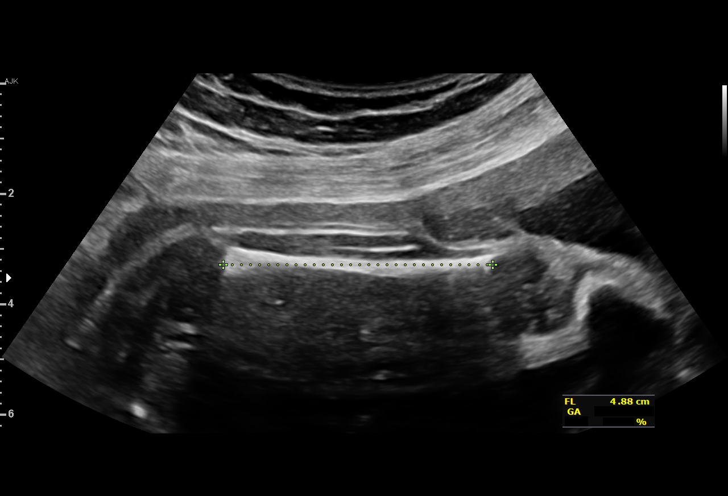
[im 10/88]
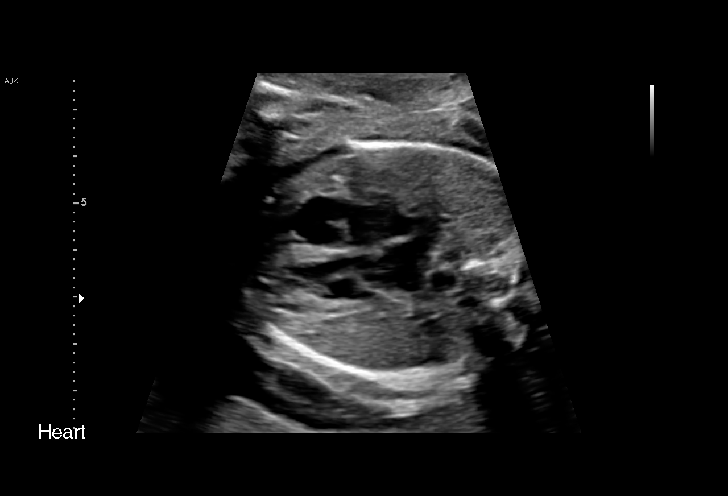
[im 17/88]
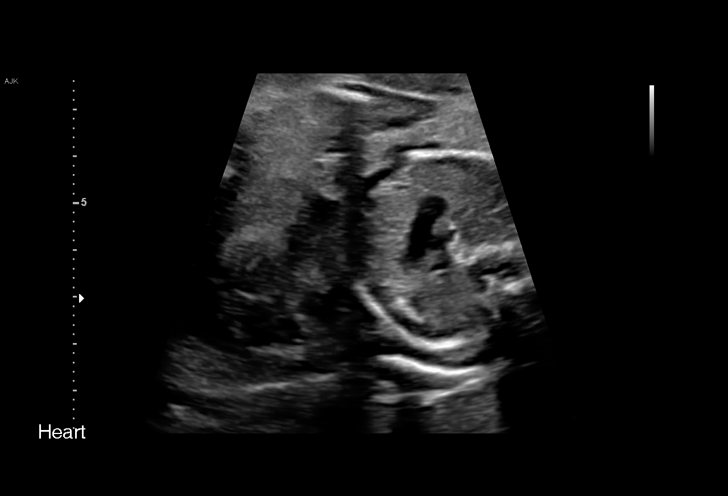
[im 23/88]
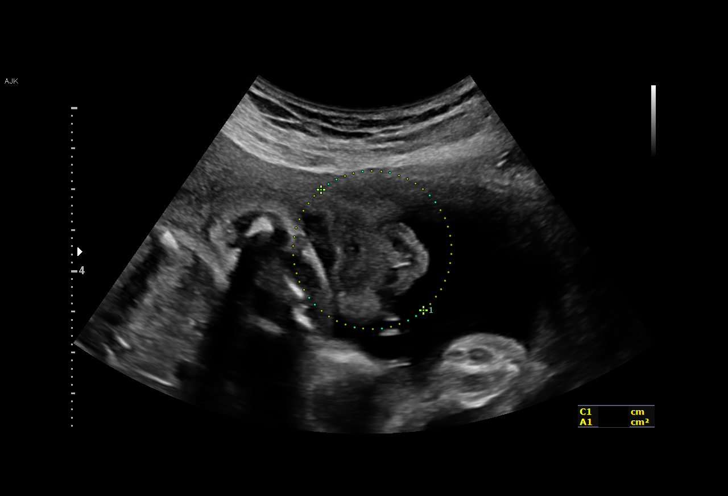
[im 30/88]
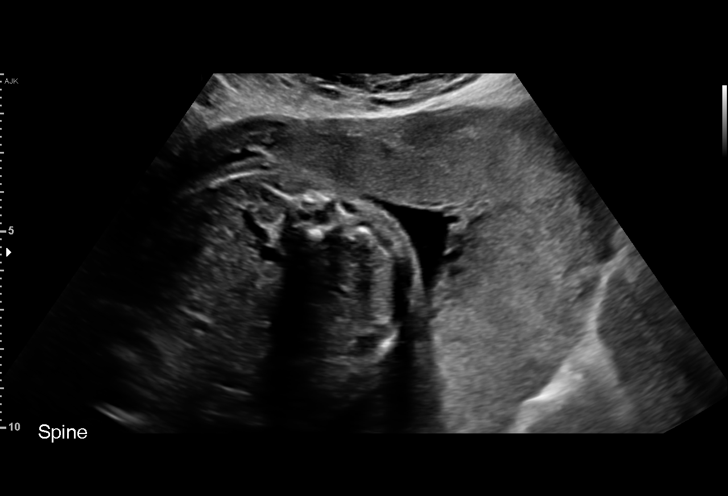
[im 36/88]
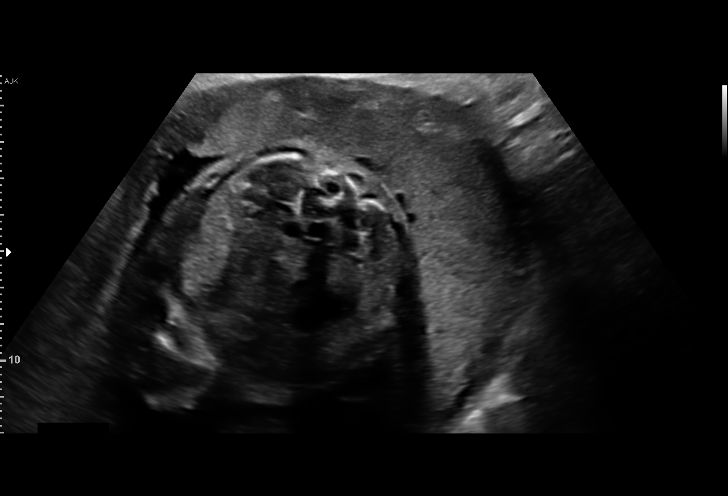
[im 46/88]
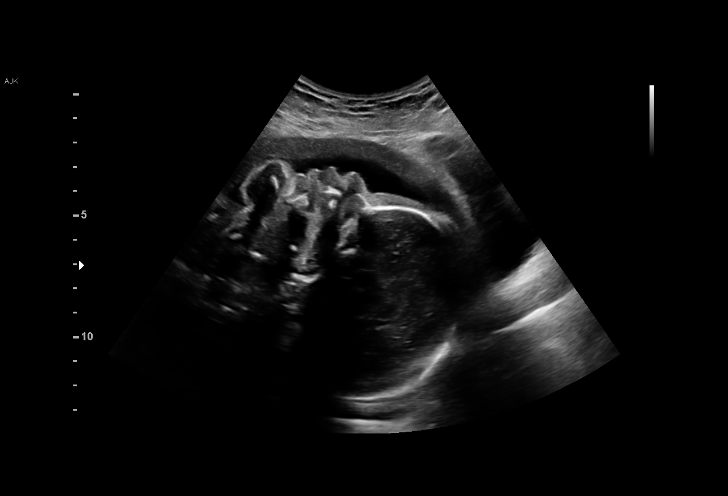
[im 52/88]
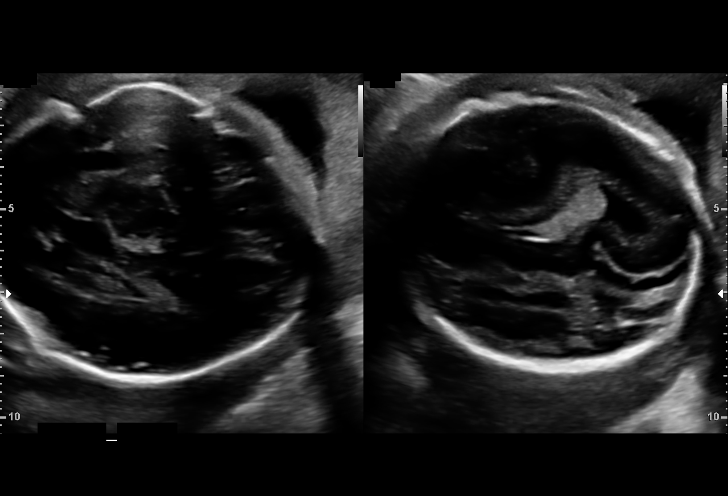
[im 59/88]
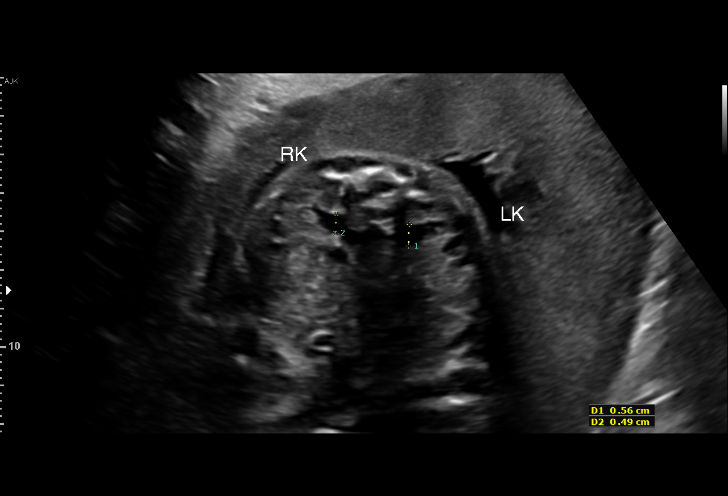
[im 65/88]
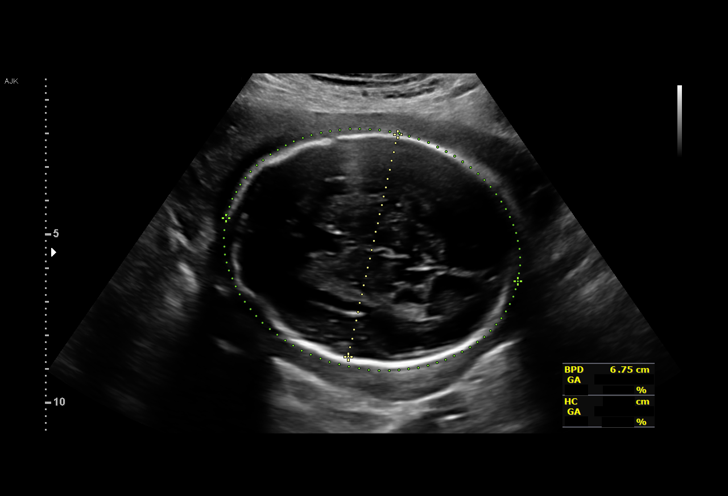
[im 71/88]
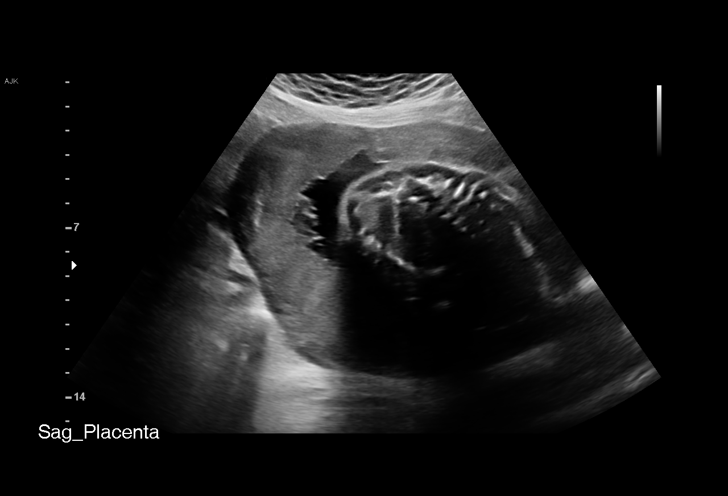
[im 78/88]
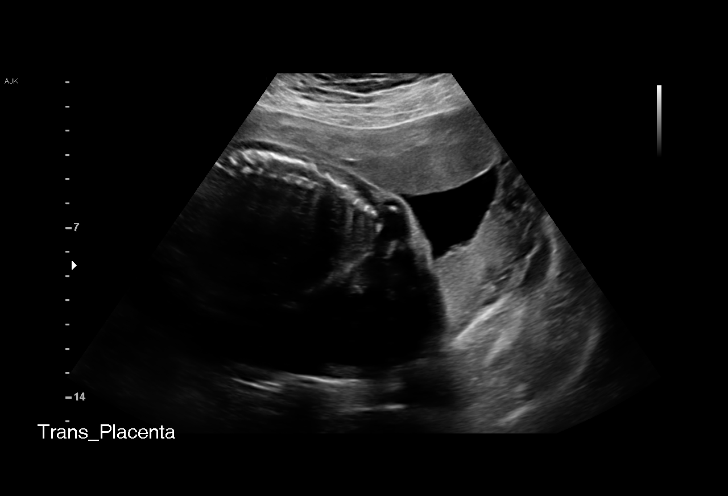
[im 84/88]
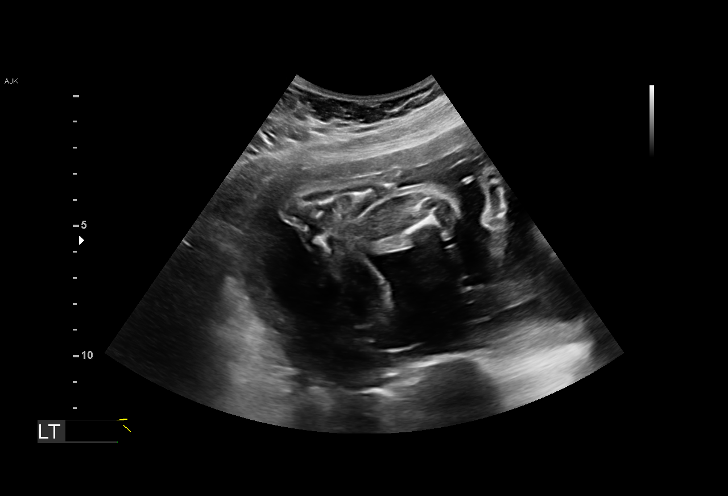

[13 of 28 positions shown; findings below may reference images not displayed]

#101

1  FIOLA POTEMPA            [id number]      [id number]     [id number]
Indications

25 weeks gestation of pregnancy
Encounter for other specified antenatal
screening
Fetal abnormality - other known or
suspected (bilateral renal pyelectasis)
Poor obstetric history: Previous
preeclampsia / eclampsia/gestational HTN
OB History

Blood Type:            Height:  5'7"   Weight (lb):  202       BMI:
Gravidity:    2         Term:   1        Prem:   0        SAB:   0
TOP:          0       Ectopic:  0        Living: 1
Fetal Evaluation

Num Of Fetuses:     1
Fetal Heart         134
Rate(bpm):
Cardiac Activity:   Observed
Presentation:       Cephalic
Placenta:           Posterior, above cervical os
P. Cord Insertion:  Visualized, central

Amniotic Fluid
AFI FV:      Subjectively within normal limits

Largest Pocket(cm)
3.77
Biometry

BPD:      67.6  mm     G. Age:  27w 2d         92  %    CI:        75.18   %    70 - 86
FL/HC:      19.7   %    18.7 -
HC:      247.3  mm     G. Age:  26w 6d         77  %    HC/AC:      1.08        1.04 -
AC:      229.3  mm     G. Age:  27w 2d         90  %    FL/BPD:     72.0   %    71 - 87
FL:       48.7  mm     G. Age:  26w 3d         65  %    FL/AC:      21.2   %    20 - 24
HUM:      45.7  mm     G. Age:  27w 0d         82  %
CER:      33.8  mm     G. Age:  29w 3d       > 95  %

Est. FW:    6336  gm      2 lb 3 oz     78  %
Gestational Age

LMP:           25w 3d        Date:  10/29/17                 EDD:   08/05/18
U/S Today:     27w 0d                                        EDD:   07/25/18
Best:          25w 3d     Det. By:  LMP  (10/29/17)          EDD:   08/05/18
Anatomy

Cranium:               Appears normal         Aortic Arch:            Appears normal
Cavum:                 Appears normal         Ductal Arch:            Appears normal
Ventricles:            Appears normal         Diaphragm:              Appears normal
Choroid Plexus:        Appears normal         Stomach:                Appears normal, left
sided
Cerebellum:            Appears normal         Abdomen:                Appears normal
Posterior Fossa:       Appears normal         Abdominal Wall:         Appears nml (cord
insert, abd wall)
Nuchal Fold:           Not applicable (>20    Cord Vessels:           Appears normal (3
wks GA)                                        vessel cord)
Face:                  Appears normal         Kidneys:                Bilat
(orbits and profile)
pyelectasis,Rt
5mm,Lt 5.6mm
Lips:                  Appears normal         Bladder:                Appears normal
Thoracic:              Appears normal         Spine:                  Appears normal
Heart:                 Appears normal         Upper Extremities:      Appears normal
(4CH, axis, and
situs)
RVOT:                  Appears normal         Lower Extremities:      Appears normal
LVOT:                  Appears normal

Other:  Fetus appears to be a male. Heels and LT 5th digit visualized. Nasal
bone visualized. Technically difficult due to fetal position.
Cervix Uterus Adnexa

Cervix
Length:           3.38  cm.
Normal appearance by transabdominal scan.

Uterus
No abnormality visualized.

Left Ovary
Not visualized.

Right Ovary
Not visualized.

Adnexa:       No abnormality visualized.
Impression

Singleton intrauterine pregnancy at 25+3 weeks with
suspected UTD here for evaluation
Review of the anatomy shows bilateral UTD, 4.9mm on the
right and 5.6mm on the left;  no other sonographic markers
for aneuploidy or structural anomalies are seen
All relevant fetal anatomy has been visualized
Amniotic fluid volume is normal
Estimated fetal weight shows growth in the 78th percentile
Recommendations

Discussed findings with the patient. Repeat scan in 4 weeks
to se if UTD persists

## 2019-11-13 IMAGING — US US MFM OB FOLLOW-UP
1 series · 14 of 28 positions shown · non-contrast
Comparison: none

[Series 1: us mfm ob follow-up · 50 acquisitions, 14 frames shown]
[im 2/50]
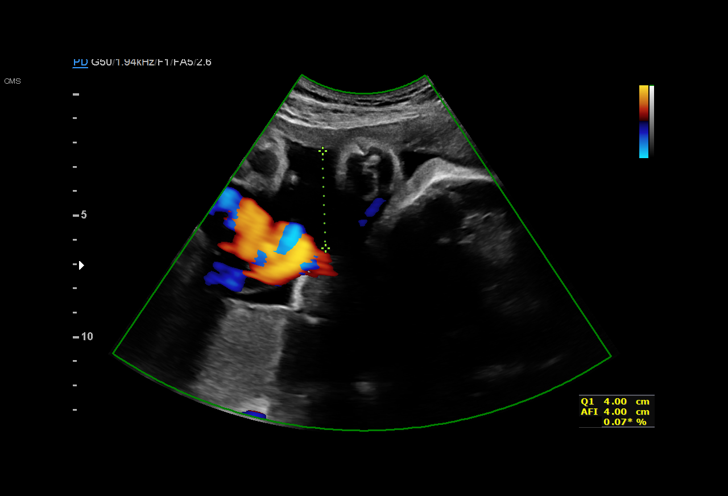
[im 6/50]
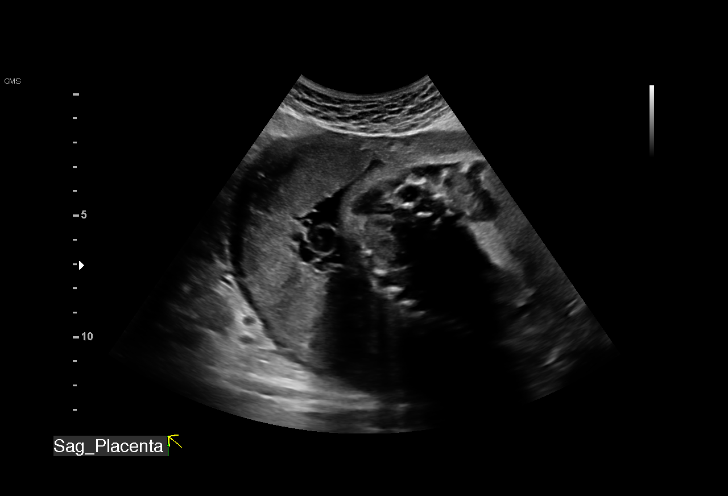
[im 10/50]
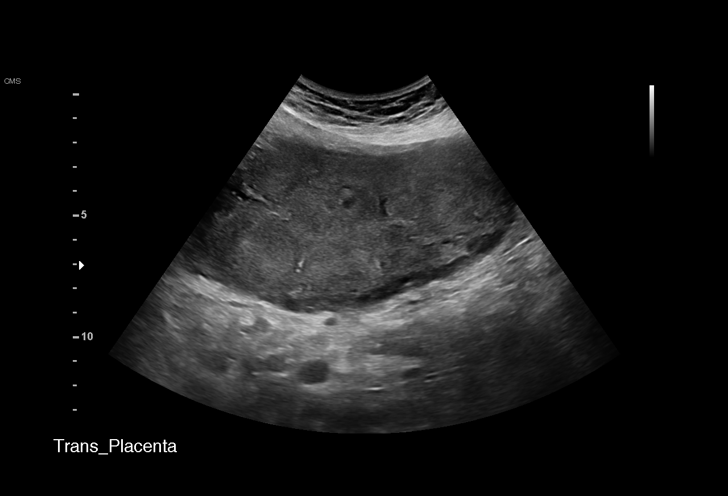
[im 13/50]
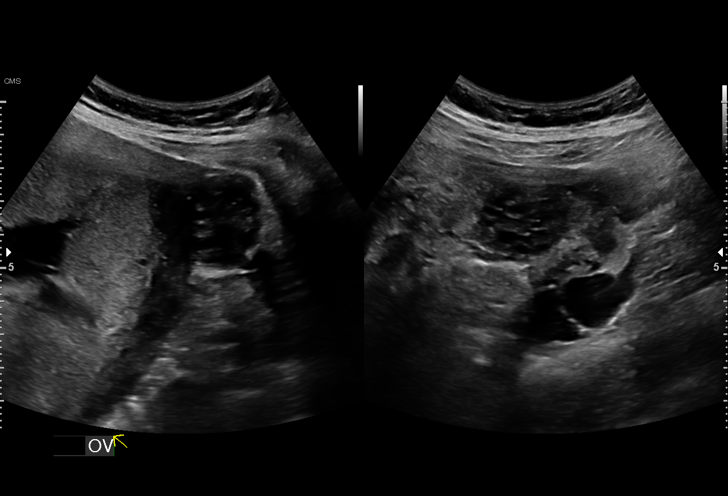
[im 17/50]
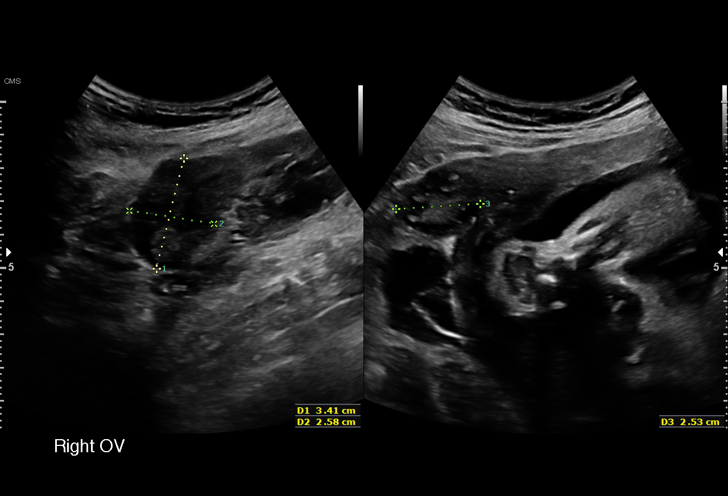
[im 20/50]
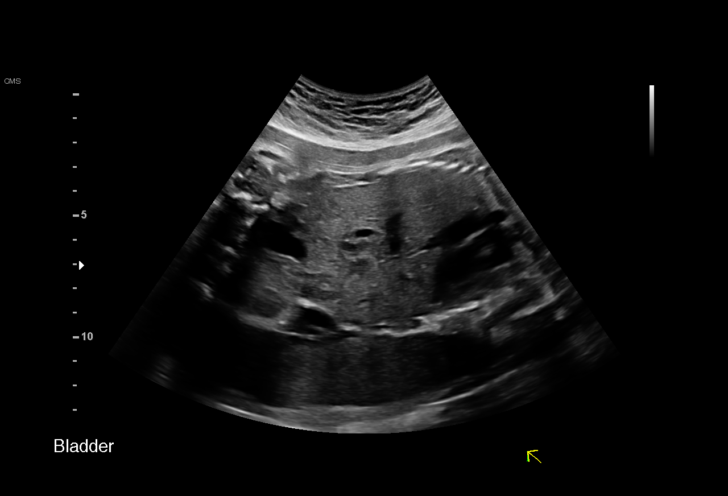
[im 24/50]
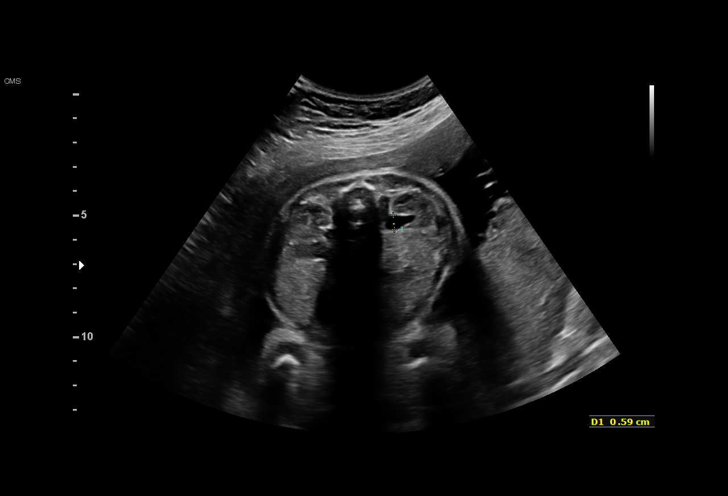
[im 28/50]
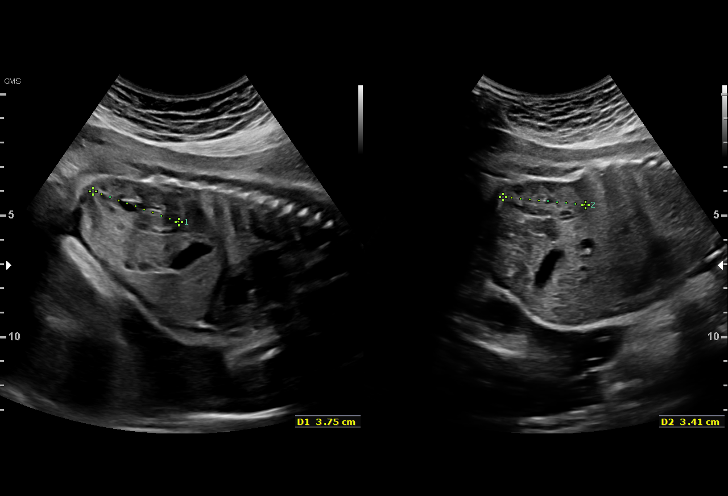
[im 31/50]
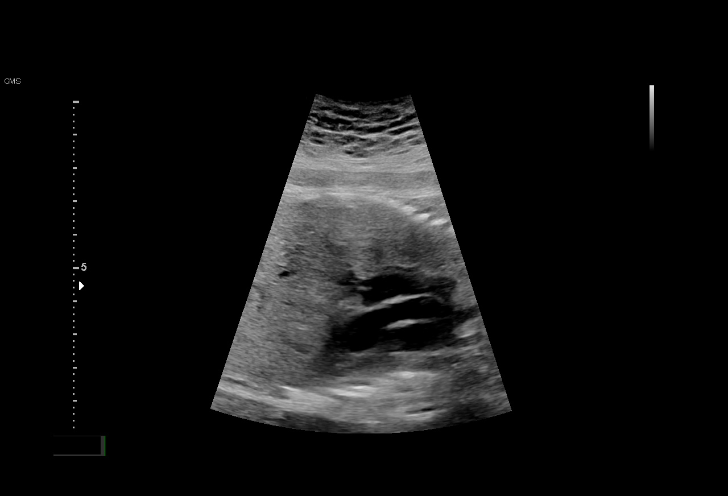
[im 35/50]
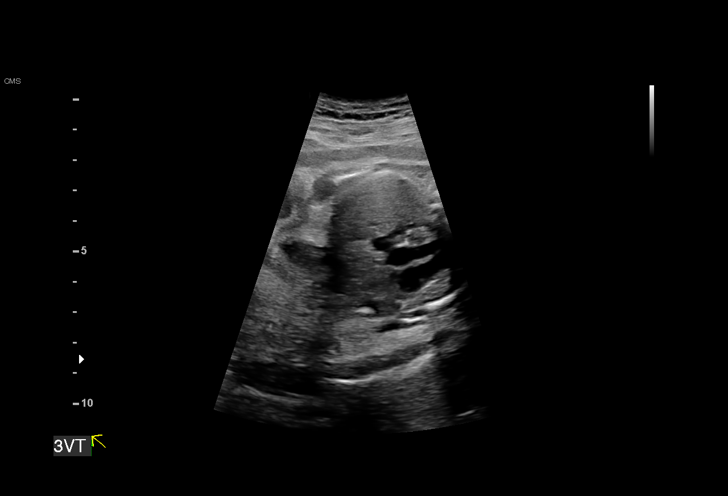
[im 39/50]
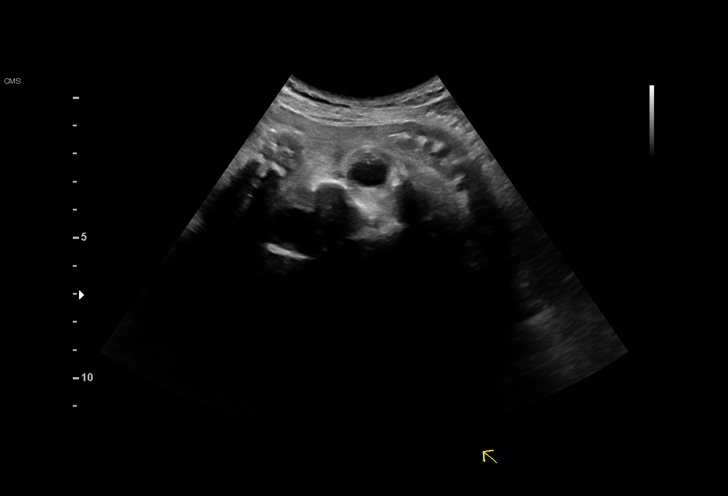
[im 42/50]
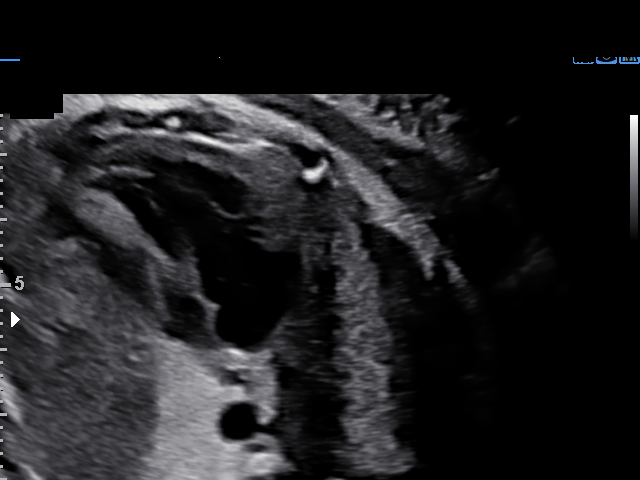
[im 46/50]
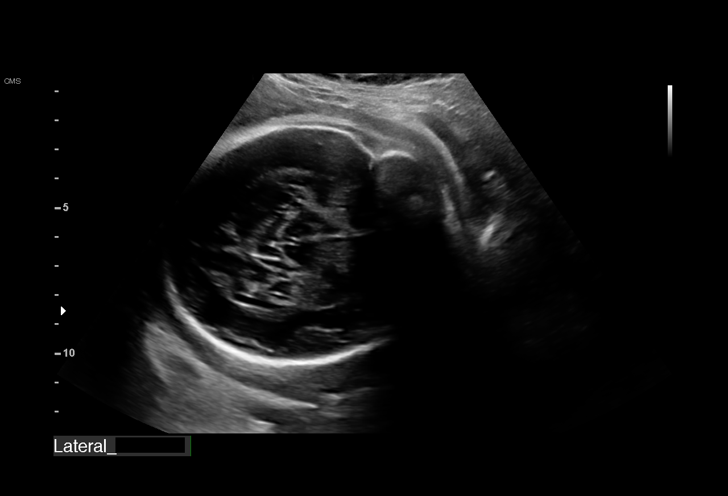
[im 50/50]
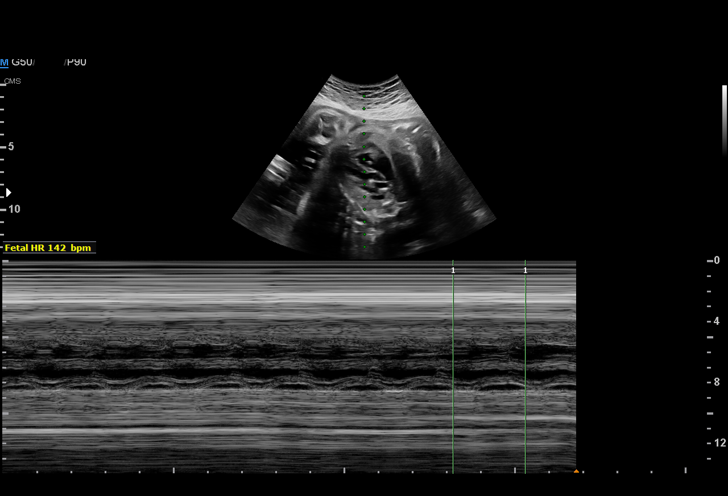

[14 of 28 positions shown; findings below may reference images not displayed]

#101

1  DASS DAVID RADWAN ISSA              [id number]      [id number]     [id number]
Indications

29 weeks gestation of pregnancy
Fetal abnormality - other known or
suspected (bilateral renal pyelectasis)
Poor obstetric history: Previous
preeclampsia / eclampsia/gestational HTN
Encounter for other antenatal screening
follow-up
OB History

Blood Type:            Height:  5'7"   Weight (lb):  202       BMI:
Gravidity:    2         Term:   1        Prem:   0        SAB:   0
TOP:          0       Ectopic:  0        Living: 1
Fetal Evaluation

Num Of Fetuses:     1
Fetal Heart         142
Rate(bpm):
Cardiac Activity:   Observed
Presentation:       Cephalic
Placenta:           Posterior, above cervical os
P. Cord Insertion:  Visualized

Amniotic Fluid
AFI FV:      Subjectively within normal limits

AFI Sum(cm)     %Tile       Largest Pocket(cm)
12.48           33          4

RUQ(cm)       RLQ(cm)       LUQ(cm)        LLQ(cm)
4
Biometry

BPD:      81.4  mm     G. Age:  32w 5d       > 99  %    CI:         75.4   %    70 - 86
FL/HC:      20.1   %    19.6 -
HC:      297.3  mm     G. Age:  33w 0d         96  %    HC/AC:      1.10        0.99 -
AC:      269.2  mm     G. Age:  31w 0d         86  %    FL/BPD:     73.3   %    71 - 87
FL:       59.7  mm     G. Age:  31w 1d         80  %    FL/AC:      22.2   %    20 - 24
HUM:      53.8  mm     G. Age:  31w 2d         84  %

Est. FW:    4754  gm    3 lb 14 oz      81  %
Gestational Age

LMP:           29w 3d        Date:  10/29/17                 EDD:   08/05/18
U/S Today:     32w 0d                                        EDD:   07/18/18
Best:          29w 3d     Det. By:  LMP  (10/29/17)          EDD:   08/05/18
Anatomy

Cranium:               Appears normal         Aortic Arch:            Appears normal
Cavum:                 Appears normal         Ductal Arch:            Appears normal
Ventricles:            Appears normal         Diaphragm:              Appears normal
Choroid Plexus:        Previously seen        Stomach:                Appears normal, left
sided
Cerebellum:            Previously seen        Abdomen:                Appears normal
Posterior Fossa:       Previously seen        Abdominal Wall:         Previously seen
Nuchal Fold:           Not applicable (>20    Cord Vessels:           Appears normal (3
wks GA)                                        vessel cord)
Face:                  Orbits and profile     Kidneys:                Appear normal
previously seen
Lips:                  Previously seen        Bladder:                Appears normal
Thoracic:              Appears normal         Spine:                  Previously seen
Heart:                 Appears normal         Upper Extremities:      Previously seen
(4CH, axis, and
situs)
RVOT:                  Appears normal         Lower Extremities:      Previously seen
LVOT:                  Appears normal

Other:  Fetus appears to be a male. Heels and LT 5th digit previously
visualized. Nasal bone visualized. Technically difficult due to fetal
position.
Cervix Uterus Adnexa

Cervix
Not visualized (advanced GA >61wks)

Uterus
No abnormality visualized.

Left Ovary
Within normal limits.

Right Ovary
Within normal limits.

Cul De Sac:   No free fluid seen.

Adnexa:       No abnormality visualized.
Impression

Single living intrauterine pregnancy at 29w 3d.
Cephalic presentation.
Placenta Posterior, above cervical os.
Normal amniotic fluid volume.
Appropriate interval fetal growth.
Normal interval fetal anatomy.
The previously seen UTD has resolved.
Recommendations

Follow-up ultrasounds as clinically indicated.

## 2019-11-18 ENCOUNTER — Other Ambulatory Visit: Payer: Self-pay

## 2019-11-18 DIAGNOSIS — Z20822 Contact with and (suspected) exposure to covid-19: Secondary | ICD-10-CM

## 2019-11-20 LAB — NOVEL CORONAVIRUS, NAA: SARS-CoV-2, NAA: NOT DETECTED

## 2019-12-02 ENCOUNTER — Other Ambulatory Visit: Payer: Self-pay

## 2019-12-02 DIAGNOSIS — Z20822 Contact with and (suspected) exposure to covid-19: Secondary | ICD-10-CM

## 2019-12-04 LAB — NOVEL CORONAVIRUS, NAA: SARS-CoV-2, NAA: NOT DETECTED

## 2022-03-03 ENCOUNTER — Ambulatory Visit: Payer: 59 | Admitting: Psychiatry

## 2022-03-03 ENCOUNTER — Encounter: Payer: Self-pay | Admitting: Psychiatry

## 2022-03-03 VITALS — BP 159/107 | HR 78 | Ht 67.0 in | Wt 178.4 lb

## 2022-03-03 DIAGNOSIS — G43109 Migraine with aura, not intractable, without status migrainosus: Secondary | ICD-10-CM | POA: Diagnosis not present

## 2022-03-03 MED ORDER — RIZATRIPTAN BENZOATE 10 MG PO TABS: 10.0000 mg | ORAL_TABLET | ORAL | 3 refills | Status: AC | PRN
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
## 2022-03-03 NOTE — Patient Instructions (Addendum)
Start Maxalt 10 mg as needed for migraines. Take at the onset of migraine. If headache recurs or does not fully resolve, you may take a second dose after 2 hours. Please avoid taking more than 2 days per week to avoid rebound headaches. You can take Excedrin with this medication as needed ? ? ?GENERAL HEADACHE INFORMATION ?Headache Preventive Treatment: Please keep in mind that it takes 4-6 weeks for the medication to start working well and 2-3 months at the appropriate dose before deciding if it will be useful or not. If it is not helping at all by this time, then we will discuss other medications to try. Supplements may take 3-6 months until you see full effect.  ? ?Natural supplements: ?Magnesium Oxide or Magnesium Glycinate 500 mg at bed (up to 800 mg daily) ?Coenzyme Q10 300 mg in AM ?Vitamin B2- 200 mg twice a day ? ?Add 1 supplement at a time since even natural supplements can have undesirable side effects. You can sometimes buy supplements cheaper (especially Coenzyme Q10) at www.https://compton-perez.com/ or at [employer]. ? ?Vitamins and herbs that show potential: ? ?Magnesium: Magnesium (250 mg twice a day or 500 mg at bed) has a relaxant effect on smooth muscles such as blood vessels. Individuals suffering from frequent or daily headache usually have low magnesium levels which can be increase with daily supplementation of 400-750 mg. Three trials found 40-90% average headache reduction  when used as a preventative. Magnesium also demonstrated the benefit in menstrually related migraine.  Magnesium is part of the messenger system in the serotonin cascade and it is a good muscle relaxant.  It is also useful for constipation which can be a side effect of other medications used to treat migraine. Good sources include nuts, whole grains, and tomatoes. Side Effects: loose stool/diarrhea ?Riboflavin (vitamin B 2) 200 mg twice a day. This vitamin assists nerve cells in the production of ATP a principal energy storing molecule.  It  is necessary for many chemical reactions in the body.  There have been at least 3 clinical trials of riboflavin using 400 mg per day all of which suggested that migraine frequency can be decreased.  All 3 trials showed significant improvement in over half of migraine sufferers.  The supplement is found in bread, cereal, milk, meat, and poultry.  Most Americans get more riboflavin than the recommended daily allowance, however riboflavin deficiency is not necessary for the supplements to help prevent headache. Side effects: energizing, green urine ? ?Coenzyme Q10: This is present in almost all cells in the body and is critical component for the conversion of energy.  Recent studies have shown that a nutritional supplement of CoQ10 can reduce the frequency of migraine attacks by improving the energy production of cells as with riboflavin.  Doses of 150 mg twice a day have been shown to be effective. ? ?HEADACHE DIET: Foods and beverages which may trigger migraine ?Note that only 20% of headache patients are food sensitive. You will know if you are food sensitive if you get a headache consistently 20 minutes to 2 hours after eating a certain food. Only cut out a food if it causes headaches, otherwise you might remove foods you enjoy! What matters most for diet is to eat a well balanced healthy diet full of vegetables and low fat protein, and to not miss meals. ? ?Chocolate, other sweets ?ALL cheeses except cottage and cream cheese ?Dairy products, yogurt, sour cream, ice cream ?Liver ?Meat extracts (Bovril, Marmite, meat tenderizers) ?Meats or  fish which have undergone aging, fermenting, pickling or smoking. These include: Hotdogs,salami,Lox,sausage, ?mortadellas,smoked salmon, pepperoni, Pickled herring ?Pods of broad bean (English beans, Chinese pea pods, [place] (fava) beans, lima and navy beans ?Ripe avocado, ripe banana ?Yeast extracts or active yeast preparations such as Brewer's or Fleishman's (commercial bakes  goods are permitted) ?Tomato based foods, pizza (lasagna, etc.) ? ?MSG (monosodium glutamate) is disguised as many things; look for these common aliases: ?Monopotassium glutamate ?Autolysed yeast ?Hydrolysed protein ?Sodium caseinate ??flavorings? ??all natural preservatives" ?Nutrasweet ? ?Avoid all other foods that convincingly provoke headaches. ? ?Resources: The Dizzy Lu Duffel Your Headache Diet, migrainestrong.com  ?https://www.aguirre.org/ ? ?Caffeine and Migraine ?For patients that have migraine, caffeine intake more than 3 days per week can lead to dependency and increased migraine frequency. I would recommend cutting back on your caffeine intake as best you can. The recommended amount of caffeine is 200-300 mg daily, although migraine patients may experience dependency at even lower doses. While you may notice an increase in headache temporarily, cutting back will be helpful for headaches in the long run. For more information on caffeine and migraine, visit: https://americanmigrainefoundation.org/resource-library/caffeine-and-migraine/ ? ?Headache Prevention Strategies: ? ?1. Maintain a headache diary; learn to identify and avoid triggers.  ?- This can be a simple note where you log when you had a headache, associated symptoms, and medications used ?- There are several smartphone apps developed to help track migraines: Migraine Buddy, Migraine Monitor, Curelator N1-Headache App ? ?Common triggers include: ?Emotional triggers: ?Emotional/Upset family or friends ?Emotional/Upset occupation ?Business reversal/success ?Anticipation anxiety ?Crisis-serious ?Post-crisis periodNew job/position ?  ?Physical triggers: ?Vacation Day ?Weekend ?Strenuous Exercise ?High Altitude Location ?New Move ?Menstrual Day ?Physical Illness ?Oversleep/Not enough sleep ?Weather changes ?Light: Photophobia or light sesnitivity treatment involves a balance between desensitization and  reduction in overly strong input. Use dark polarized glasses outside, but not inside. Avoid bright or fluorescent light, but do not dim environment to the point that going into a normally lit room hurts. Consider FL-41 tint lenses, which reduce the most irritating wavelengths without blocking too much light.  These can be obtained at axonoptics.com or theraspecs.com ?Foods: see list above. ? ?2. Limit use of acute treatments (over-the-counter medications, triptans, etc.) to no more than 2 days per week or 10 days per month to prevent medication overuse headache (rebound headache).   ? ?3. Follow a regular schedule (including weekends and holidays): ?Don't skip meals. Eat a balanced diet. ?8 hours of sleep nightly. ?Minimize stress. ?Exercise 30 minutes per day. Being overweight is associated with a 5 times increased risk of chronic migraine. ?Keep well hydrated and drink 6-8 glasses of water per day. ? ?4. Initiate non-pharmacologic measures at the earliest onset of your headache. ?Rest and quiet environment. ?Relax and reduce stress. Breathe2Relax is a free app that can instruct you on    some simple relaxtion and breathing techniques. Http://Dawnbuse.com is a    free website that provides teaching videos on relaxation.  Also, there are  many apps that   can be downloaded for ?mindful? relaxation.  An app called YOGA NIDRA will help walk you through mindfulness. Another app called Calm can be downloaded to give you a structured mindfulness guide with daily reminders and skill development. Headspace for guided meditation ?Mindfulness Based Stress Reduction Online Course: www.palousemindfulness.com ?Cold compresses. ? ?5. Don't wait!! Take the maximum allowable dosage of prescribed medication at the first sign of migraine. ? ?6. Compliance:  Take prescribed medication regularly as directed and at the first sign  of a migraine. ? ?7. Communicate:  Call your physician when problems arise, especially if your headaches  change, increase in frequency/severity, or become associated with neurological symptoms (weakness, numbness, slurred speech, etc.). ? ?8. Headache/pain management therapies: Consider various complement

## 2022-07-21 ENCOUNTER — Ambulatory Visit: Payer: 59 | Admitting: Psychiatry
